# Patient Record
Sex: Male | Born: 1940
Health system: Southern US, Community
[De-identification: ages and names within clinical notes are randomized; demographics above are authoritative.]

## PROBLEM LIST (undated history)

## (undated) DIAGNOSIS — I4891 Unspecified atrial fibrillation: Secondary | ICD-10-CM

## (undated) DIAGNOSIS — E785 Hyperlipidemia, unspecified: Secondary | ICD-10-CM

## (undated) DIAGNOSIS — J189 Pneumonia, unspecified organism: Secondary | ICD-10-CM

## (undated) DIAGNOSIS — R739 Hyperglycemia, unspecified: Secondary | ICD-10-CM

## (undated) DIAGNOSIS — I6529 Occlusion and stenosis of unspecified carotid artery: Secondary | ICD-10-CM

## (undated) DIAGNOSIS — I10 Essential (primary) hypertension: Secondary | ICD-10-CM

## (undated) DIAGNOSIS — I2510 Atherosclerotic heart disease of native coronary artery without angina pectoris: Secondary | ICD-10-CM

## (undated) DIAGNOSIS — G4730 Sleep apnea, unspecified: Secondary | ICD-10-CM

## (undated) DIAGNOSIS — M1712 Unilateral primary osteoarthritis, left knee: Secondary | ICD-10-CM

## (undated) DIAGNOSIS — R001 Bradycardia, unspecified: Secondary | ICD-10-CM

## (undated) HISTORY — DX: Atherosclerotic heart disease of native coronary artery without angina pectoris: I25.10

## (undated) HISTORY — DX: Unspecified atrial fibrillation: I48.91

## (undated) HISTORY — DX: Occlusion and stenosis of unspecified carotid artery: I65.29

## (undated) HISTORY — DX: Sleep apnea, unspecified: G47.30

## (undated) HISTORY — DX: Hyperlipidemia, unspecified: E78.5

## (undated) HISTORY — DX: Essential (primary) hypertension: I10

## (undated) HISTORY — PX: TONSILLECTOMY: SUR1361

## (undated) HISTORY — PX: KNEE ARTHROSCOPY: SUR90

---

## 1999-07-21 ENCOUNTER — Ambulatory Visit (HOSPITAL_COMMUNITY): Admission: RE | Admit: 1999-07-21 | Discharge: 1999-07-21 | Payer: Self-pay | Admitting: *Deleted

## 2008-11-01 HISTORY — PX: CORONARY ARTERY BYPASS GRAFT: SHX141

## 2008-11-11 ENCOUNTER — Ambulatory Visit: Payer: Self-pay | Admitting: Internal Medicine

## 2008-11-15 ENCOUNTER — Encounter: Payer: Self-pay | Admitting: Cardiothoracic Surgery

## 2008-11-15 ENCOUNTER — Ambulatory Visit: Payer: Self-pay | Admitting: Cardiothoracic Surgery

## 2008-11-15 ENCOUNTER — Ambulatory Visit: Payer: Self-pay | Admitting: Cardiology

## 2008-11-15 ENCOUNTER — Ambulatory Visit: Payer: Self-pay | Admitting: Vascular Surgery

## 2008-11-15 ENCOUNTER — Inpatient Hospital Stay (HOSPITAL_COMMUNITY): Admission: EM | Admit: 2008-11-15 | Discharge: 2008-11-23 | Payer: Self-pay | Admitting: *Deleted

## 2008-12-10 ENCOUNTER — Ambulatory Visit: Payer: Self-pay | Admitting: Internal Medicine

## 2008-12-12 ENCOUNTER — Ambulatory Visit: Payer: Self-pay | Admitting: Cardiothoracic Surgery

## 2008-12-12 ENCOUNTER — Encounter: Admission: RE | Admit: 2008-12-12 | Discharge: 2008-12-12 | Payer: Self-pay | Admitting: Cardiothoracic Surgery

## 2008-12-19 ENCOUNTER — Encounter (HOSPITAL_COMMUNITY): Admission: RE | Admit: 2008-12-19 | Discharge: 2009-01-03 | Payer: Self-pay | Admitting: Cardiology

## 2009-01-22 DIAGNOSIS — Z9889 Other specified postprocedural states: Secondary | ICD-10-CM

## 2009-01-22 DIAGNOSIS — I10 Essential (primary) hypertension: Secondary | ICD-10-CM | POA: Insufficient documentation

## 2009-01-22 DIAGNOSIS — I2510 Atherosclerotic heart disease of native coronary artery without angina pectoris: Secondary | ICD-10-CM

## 2009-01-22 DIAGNOSIS — E785 Hyperlipidemia, unspecified: Secondary | ICD-10-CM

## 2009-01-22 DIAGNOSIS — Z9089 Acquired absence of other organs: Secondary | ICD-10-CM

## 2009-01-22 DIAGNOSIS — Z951 Presence of aortocoronary bypass graft: Secondary | ICD-10-CM | POA: Insufficient documentation

## 2009-01-22 DIAGNOSIS — I4891 Unspecified atrial fibrillation: Secondary | ICD-10-CM | POA: Insufficient documentation

## 2009-01-22 DIAGNOSIS — I252 Old myocardial infarction: Secondary | ICD-10-CM

## 2009-01-23 ENCOUNTER — Encounter: Payer: Self-pay | Admitting: Internal Medicine

## 2009-01-23 ENCOUNTER — Ambulatory Visit: Payer: Self-pay | Admitting: Internal Medicine

## 2009-04-04 ENCOUNTER — Encounter: Payer: Self-pay | Admitting: Internal Medicine

## 2009-04-07 ENCOUNTER — Encounter: Payer: Self-pay | Admitting: Internal Medicine

## 2009-05-21 ENCOUNTER — Telehealth: Payer: Self-pay | Admitting: Internal Medicine

## 2009-07-09 ENCOUNTER — Ambulatory Visit: Payer: Self-pay | Admitting: Internal Medicine

## 2009-08-11 ENCOUNTER — Encounter: Payer: Self-pay | Admitting: Internal Medicine

## 2009-08-11 ENCOUNTER — Ambulatory Visit (HOSPITAL_BASED_OUTPATIENT_CLINIC_OR_DEPARTMENT_OTHER): Admission: RE | Admit: 2009-08-11 | Discharge: 2009-08-11 | Payer: Self-pay | Admitting: Internal Medicine

## 2009-08-26 ENCOUNTER — Ambulatory Visit: Payer: Self-pay | Admitting: Pulmonary Disease

## 2009-12-09 ENCOUNTER — Encounter: Payer: Self-pay | Admitting: Internal Medicine

## 2009-12-17 ENCOUNTER — Telehealth: Payer: Self-pay | Admitting: Internal Medicine

## 2010-01-14 ENCOUNTER — Ambulatory Visit: Payer: Self-pay | Admitting: Internal Medicine

## 2010-02-04 ENCOUNTER — Ambulatory Visit: Payer: Self-pay

## 2010-02-04 ENCOUNTER — Encounter: Payer: Self-pay | Admitting: Internal Medicine

## 2010-02-25 ENCOUNTER — Ambulatory Visit: Payer: Self-pay | Admitting: Internal Medicine

## 2010-02-26 LAB — CONVERTED CEMR LAB
Albumin: 3.9 g/dL (ref 3.5–5.2)
Alkaline Phosphatase: 82 units/L (ref 39–117)
Cholesterol: 137 mg/dL (ref 0–200)
LDL Cholesterol: 81 mg/dL (ref 0–99)
Total CHOL/HDL Ratio: 3
Total Protein: 6.9 g/dL (ref 6.0–8.3)
Triglycerides: 42 mg/dL (ref 0.0–149.0)
VLDL: 8.4 mg/dL (ref 0.0–40.0)

## 2010-06-15 ENCOUNTER — Telehealth: Payer: Self-pay | Admitting: Internal Medicine

## 2010-06-26 ENCOUNTER — Telehealth: Payer: Self-pay | Admitting: Internal Medicine

## 2010-07-02 ENCOUNTER — Encounter (INDEPENDENT_AMBULATORY_CARE_PROVIDER_SITE_OTHER): Payer: Self-pay | Admitting: *Deleted

## 2010-08-19 ENCOUNTER — Ambulatory Visit: Payer: Self-pay | Admitting: Internal Medicine

## 2010-10-21 IMAGING — CT CT HEAD W/O CM
1 series · 16 of 30 positions shown, 20 images · non-contrast
Comparison: None

CLINICAL DATA: Episode of dizziness and unsteady gait 1 week ago.
Hypertension.

CT HEAD WITHOUT CONTRAST
TECHNIQUE: Contiguous axial images were obtained from the base of
the skull through the vertex without intravenous contrast.

[Series 2: head_seq 4.5 h37s st · axial · 0.45mm/px · z∈[-140,+8]mm · 16 of 36 slices shown, 20 images]
[im 2/36  brain]
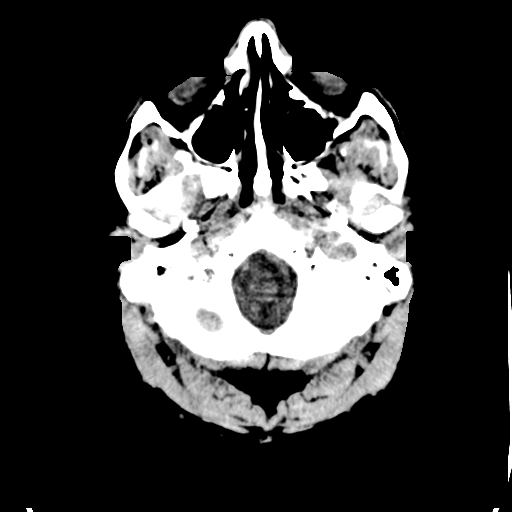
[im 2/36  bone]
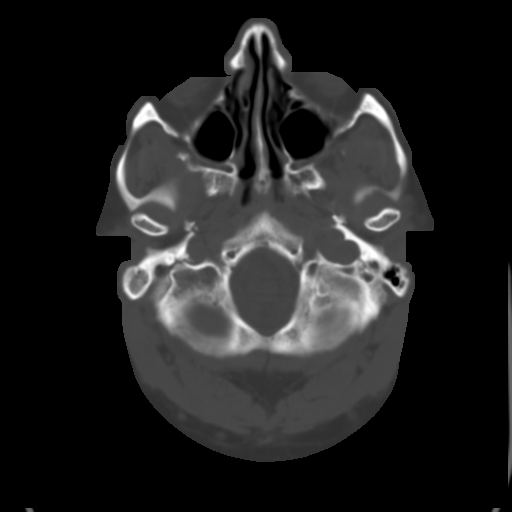
[im 4/36  brain]
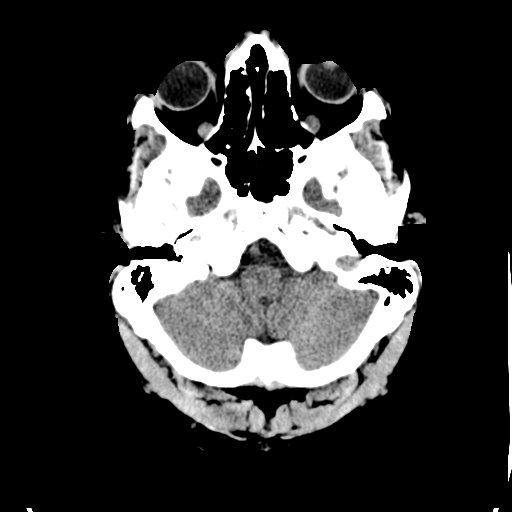
[im 7/36  brain]
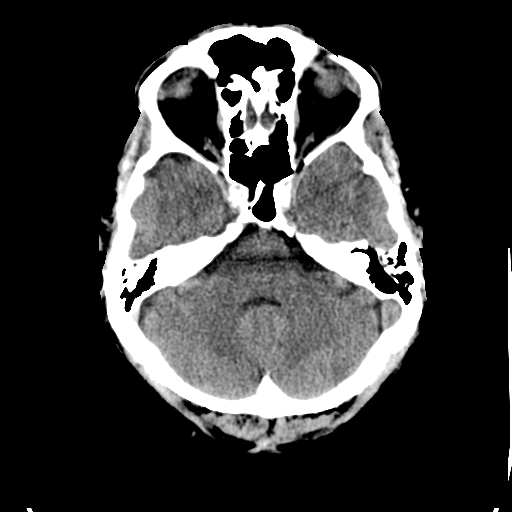
[im 9/36  brain]
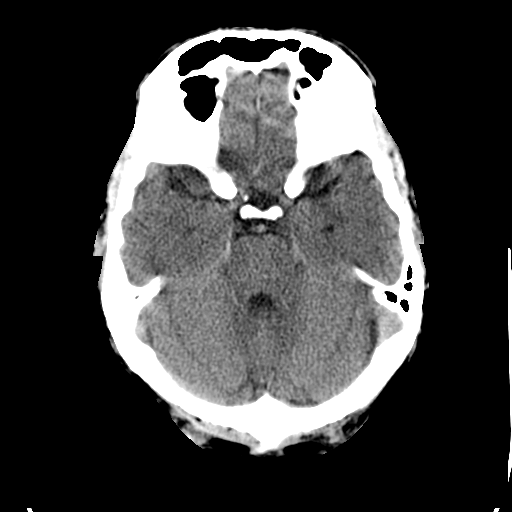
[im 10/36  brain]
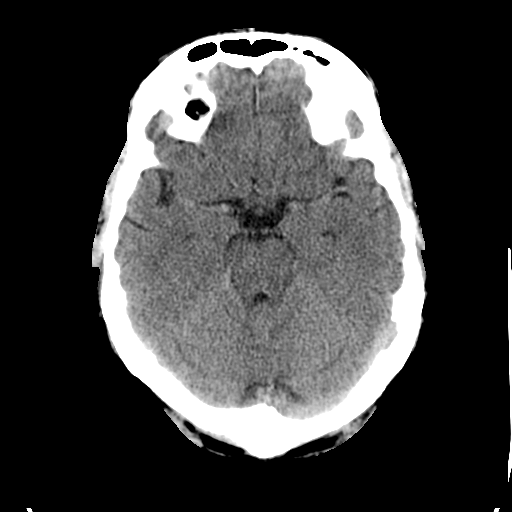
[im 10/36  bone]
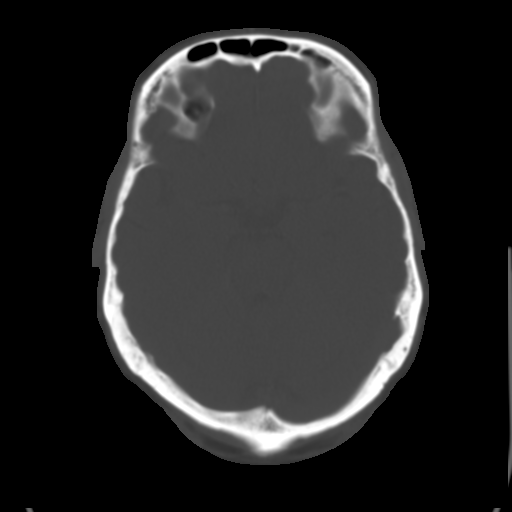
[im 13/36  brain]
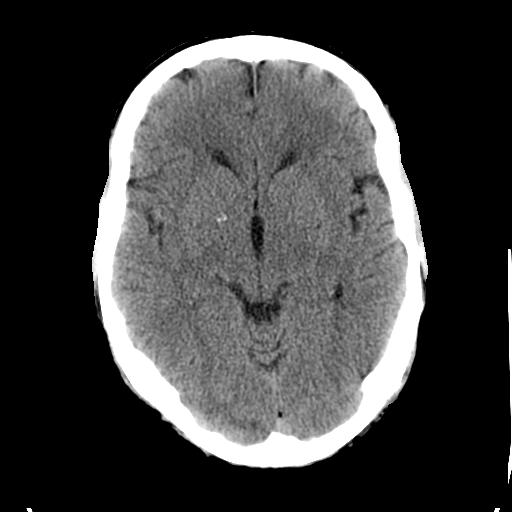
[im 15/36  brain]
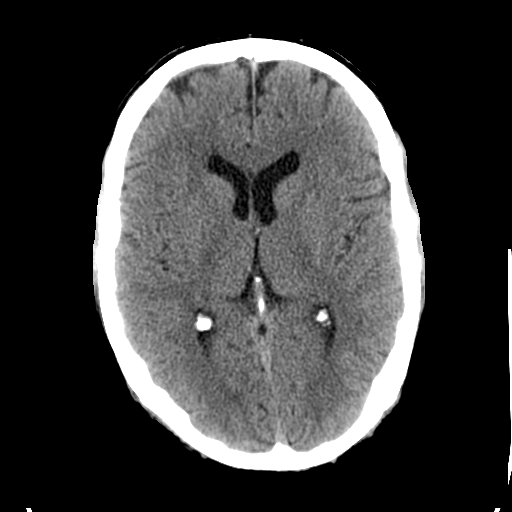
[im 17/36  brain]
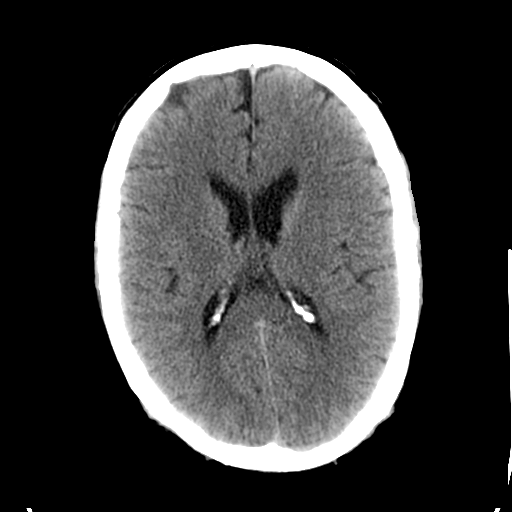
[im 19/36  brain]
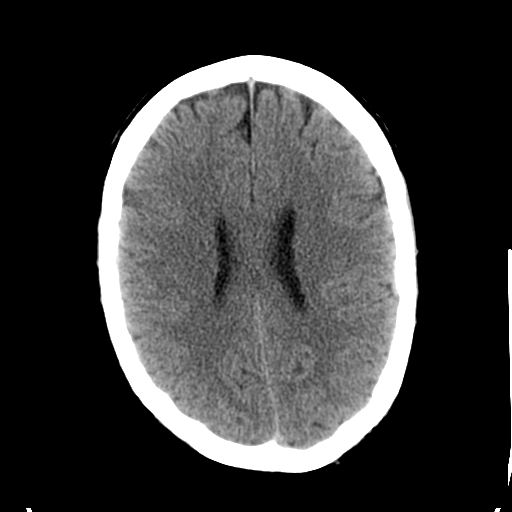
[im 19/36  bone]
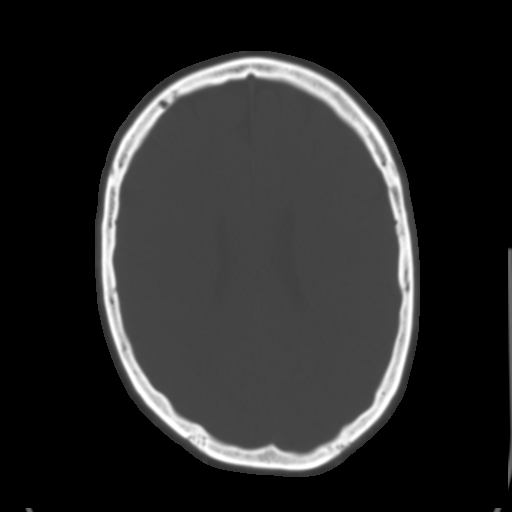
[im 21/36  brain]
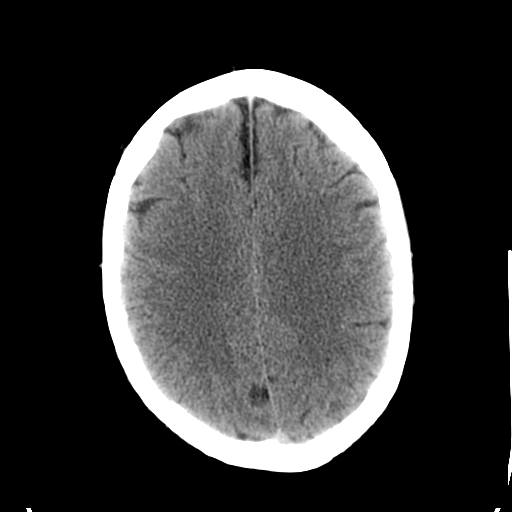
[im 23/36  brain]
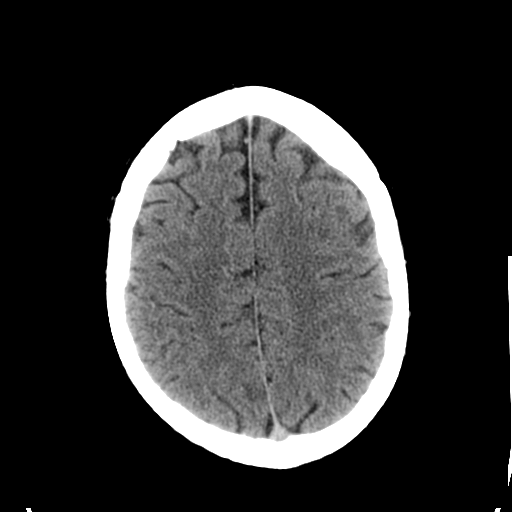
[im 26/36  brain]
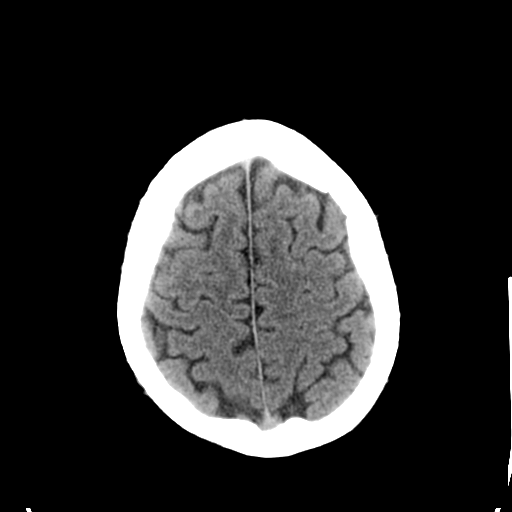
[im 27/36  brain]
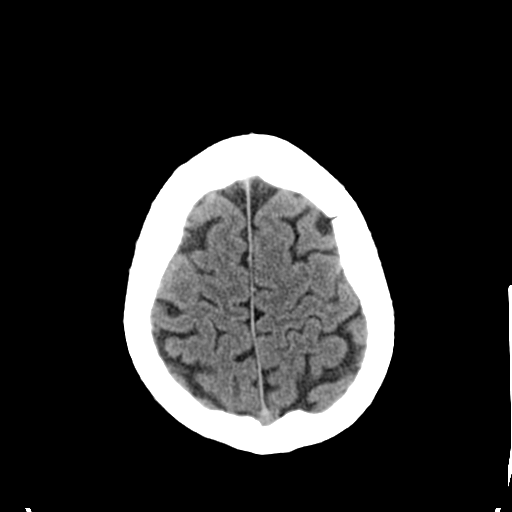
[im 27/36  bone]
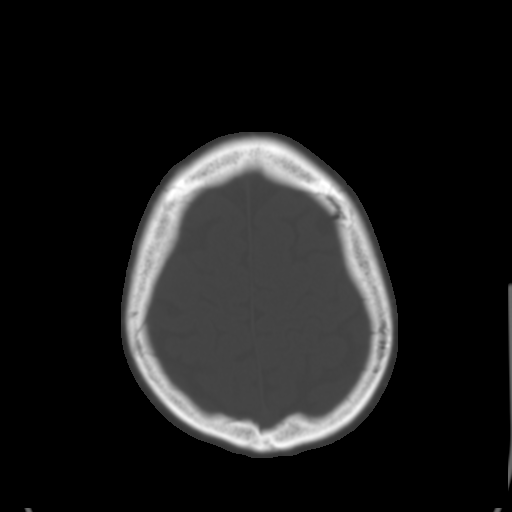
[im 29/36  brain]
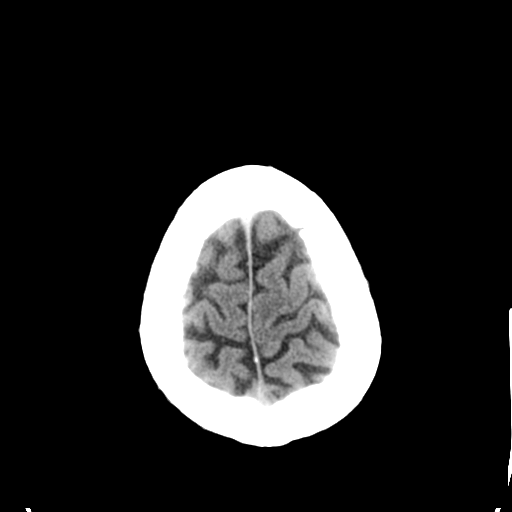
[im 32/36  brain]
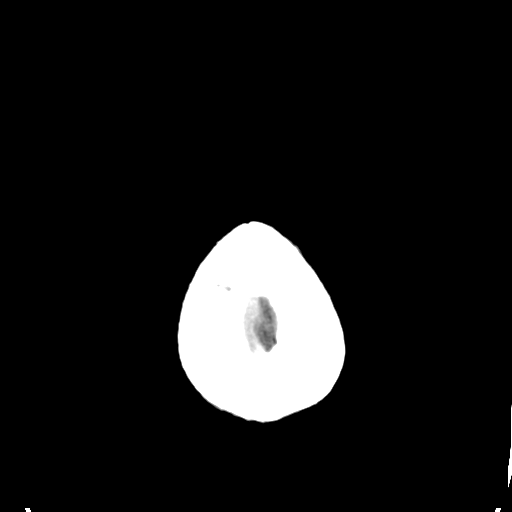
[im 34/36  brain]
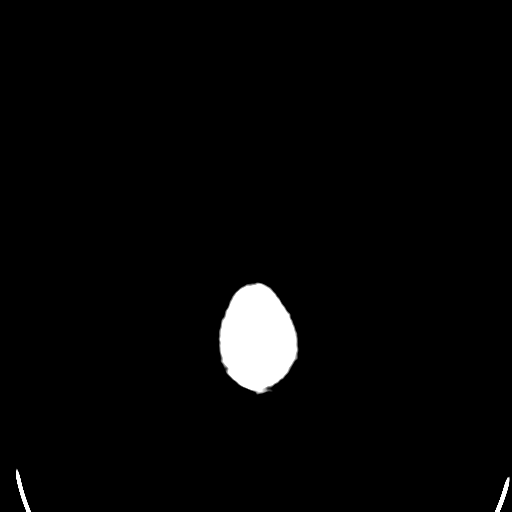

[16 of 30 positions shown; findings below may reference images not displayed]

FINDINGS: There is no evidence of acute intracranial hemorrhage,
mass lesion, brain edema or extra-axial fluid collection.  The
ventricles and subarachnoid spaces are appropriately sized for age.
There is no CT evidence of acute infarction.  Low density adjacent
to the right parietal lobe on image 21 is attributed to volume
averaging with an adjacent subarachnoid space

The visualized paranasal sinuses are clear.  The calvarium is
intact.
IMPRESSION: No acute intracranial findings.

## 2010-12-03 NOTE — Assessment & Plan Note (Signed)
Summary: Charles House  Medications Added VITAMIN D3 1000 UNIT CAPS (CHOLECALCIFEROL) once daily      Allergies Added: NKDA  Primary Charles House:  Charles Bouche, MD   History of Present Illness: The patient presents today for routine cardiology followup. He reports doing very well since last being seen in our clinic.  He has lost 27 lbs.  He remains active without difficulty.  The patient denies symptoms of palpitations, chest pain, shortness of breath, orthopnea, PND, lower extremity edema, dizziness, presyncope, syncope, or neurologic sequela. The patient is tolerating medications without difficulties and is otherwise without complaint today.   Current Medications (verified): 1)  Aspirin 325 Mg Tabs (Aspirin) .... Once Daily 2)  Lipitor 20 Mg Tabs (Atorvastatin Calcium) .... Take One Tablet By Mouth Daily. 3)  Vitamin D3 1000 Unit Caps (Cholecalciferol) .... Once Daily  Allergies (verified): No Known Drug Allergies  Past History:  Past Medical History: Reviewed history from 01/14/2010 and no changes required.   1. Coronary artery disease, status post 6-vessel CABG on November 18, 2008, with a LIMA to LAD, SVG sequential to second obtuse marginal,       third obtuse marginal and distal circumflex, reverse saphenous vein       graft sequential to the distal right coronary artery, and the mid       posterior descending coronary artery, and left internal mammary to       the left anterior descending.   2. Hyperlipidemia.   3. Symptoms of sleep apnea.   4. Hypertension.   5. Paroxysmal atrial fibrillation.  6. Moderate bilateral carotid artery stenosis by ultrasound 1/10  Past Surgical History: Reviewed history from 01/22/2009 and no changes required. TONSILLECTOMY, HX OF (ICD-V45.79) ARTHROSCOPY, RIGHT KNEE, HX OF (ICD-V45.89) CORONARY ARTERY BYPASS GRAFT, HX OF (ICD-V45.81)  Vital Signs:  Patient profile:   70 year old male Height:      70 inches Weight:      180 pounds BMI:      25.92 Pulse rate:   55 / minute Pulse rhythm:   regular BP sitting:   122 / 70  (left arm)  Vitals Entered By: Laurance Flatten CMA (August 19, 2010 10:45 AM)  Physical Exam  General:  Well developed, well nourished, in no acute distress. Head:  normocephalic and atraumatic Eyes:  PERRLA/EOM intact; conjunctiva and lids normal. Mouth:  Teeth, gums and palate normal. Oral mucosa normal. Neck:  Neck supple, no JVD. No masses, thyromegaly or abnormal cervical nodes. Lungs:  Clear bilaterally to auscultation and percussion. Heart:  Non-displaced PMI, chest non-tender; regular rate and rhythm, S1, S2 without murmurs, rubs or gallops. Carotid upstroke normal, no bruit. Normal abdominal aortic size, no bruits. Femorals normal pulses, no bruits. Pedals normal pulses. No edema, no varicosities. Abdomen:  Bowel sounds positive; abdomen soft and non-tender without masses, organomegaly, or hernias noted. No hepatosplenomegaly. Msk:  Back normal, normal gait. Muscle strength and tone normal. Pulses:  pulses normal in all 4 extremities Extremities:  No clubbing or cyanosis. Neurologic:  Alert and oriented x 3.   EKG  Procedure date:  08/19/2010  Findings:      sinus bradycardia 55 bpm, otherwise normal ekg  Impression & Recommendations:  Problem # 1:  CORONARY ARTERY DISEASE (ICD-414.00) doing well continue ASA and lipitor not on beta blockers due to asymptomatic bradycardia  Problem # 2:  SLEEP APNEA, HX OF (ICD-780.57) controlled with weight loss  Problem # 3:  HYPERLIPIDEMIA (ICD-272.4) controlled  with lipitor  Other Orders: EKG w/ Interpretation (93000)  Patient Instructions: 1)  return in 12 months

## 2010-12-03 NOTE — Progress Notes (Signed)
Summary: tooth extraction   Phone Note From Other Clinic   Caller: nurse susie Summary of Call: pt needs 9 extractions how long does pt need to off coumadin prior to extraction  ofc [phone] Susie. pt wants to have this done ASAP Initial call taken by: Edman Circle,  June 15, 2010 9:57 AM  Follow-up for Phone Call        ok to stop 5 days prior per Dr [name] spoke with Susie she is aware Dennis Bast, RN, BSN  June 15, 2010 2:04 PM     Appended Document: tooth extraction pt not on Coumadin but will stop Asprin 325mg  daily

## 2010-12-03 NOTE — Progress Notes (Signed)
Summary: pt needs to have teeth extracted   Phone Note Call from Patient Call back at Home Phone [phone]   Caller: Spouse/Charles House Reason for Call: Talk to Nurse, Talk to Doctor Summary of Call: pt wants to have all teeth extracted at one time and wife is concerned and want to make sure it will be alright for that to be done all at one time Initial call taken by: Omer Jack,  June 26, 2010 11:49 AM  Follow-up for Phone Call        I spoke with Charles House wife.  Her husband is having all teeth extracted without sedation.  He will follow as ordered before and stop Coumadin 5 days prior to procedure.  She wanted Dr. [name] to know. I will forward this to Dr. Ranee Gosselin RN

## 2010-12-03 NOTE — Progress Notes (Signed)
Summary: req call back   Phone Note Call from Patient Call back at Home Phone [phone]   Caller: Spouse Reason for Call: Talk to Nurse Summary of Call: request to speak to nurse regards to last ph call Initial call taken by: Migdalia Dk,  December 17, 2009 11:14 AM  Follow-up for Phone Call        spoke with wife and husband.  He went to eye doctor today and they said he had some cholesterol "flakes" behind his eye.  Wanting to know what to do.  Spoke with Dr [name] we have just increased his cholesterol med and this will help with the reduction of that.  Pt aware. Dennis Bast, RN, BSN  December 17, 2009 1:57 PM

## 2010-12-03 NOTE — Assessment & Plan Note (Signed)
Summary: 6 MONTH/DMP  Medications Added SIMVASTATIN 20 MG TABS (SIMVASTATIN) one by mouth at bedtime      Allergies Added: NKDA  Visit Type:  Follow-up Primary Provider:  [name], MD   History of Present Illness: The patient presents today for routine cardiology followup. He reports doing very well since last being seen in our clinic. The patient denies any symptoms of palpitations, chest pain, shortness of breath, orthopnea, PND, lower extremity edema, erectile dysfunction, presyncope, syncope, or neurologic sequela.  Current Medications (verified): 1)  Lipitor 20 Mg Tabs (Atorvastatin Calcium) .... Take One Tablet By Mouth Daily. 2)  Aspirin 325 Mg Tabs (Aspirin) .... Once Daily  Allergies (verified): No Known Drug Allergies  Past History:  Past Medical History:   1. Coronary artery disease, status post 6-vessel CABG on November 18, 2008, with a LIMA to LAD, SVG sequential to second obtuse marginal,       third obtuse marginal and distal circumflex, reverse saphenous vein       graft sequential to the distal right coronary artery, and the mid       posterior descending coronary artery, and left internal mammary to       the left anterior descending.   2. Hyperlipidemia.   3. Symptoms of sleep apnea.   4. Hypertension.   5. Paroxysmal atrial fibrillation.  6. Moderate bilateral carotid artery stenosis by ultrasound 1/10  Past Surgical History: Reviewed history from 01/22/2009 and no changes required. TONSILLECTOMY, HX OF (ICD-V45.79) ARTHROSCOPY, RIGHT KNEE, HX OF (ICD-V45.89) CORONARY ARTERY BYPASS GRAFT, HX OF (ICD-V45.81)  Vital Signs:  Patient profile:   70 year old male Height:      70 inches Weight:      207 pounds BMI:     29.81 Pulse rate:   65 / minute BP sitting:   120 / 80  (left arm)  Vitals Entered By: Laurance Flatten CMA (January 14, 2010 10:02 AM)  Physical Exam  General:  Well developed, well nourished, in no acute distress. Head:   normocephalic and atraumatic Eyes:  PERRLA/EOM intact; conjunctiva and lids normal. Mouth:  Teeth, gums and palate normal. Oral mucosa normal. Neck:  Neck supple, no JVD. No masses, thyromegaly or abnormal cervical nodes. Lungs:  Clear bilaterally to auscultation and percussion. Heart:  Non-displaced PMI, chest non-tender; regular rate and rhythm, S1, S2 without murmurs, rubs or gallops. Carotid upstroke normal, no bruit. Normal abdominal aortic size, no bruits. Femorals normal pulses, no bruits. Pedals normal pulses. No edema, no varicosities. Abdomen:  Bowel sounds positive; abdomen soft and non-tender without masses, organomegaly, or hernias noted. No hepatosplenomegaly. Msk:  Back normal, normal gait. Muscle strength and tone normal. Pulses:  pulses normal in all 4 extremities Extremities:  No clubbing or cyanosis. Neurologic:  Alert and oriented x 3. Skin:  Intact without lesions or rashes. Cervical Nodes:  no significant adenopathy Psych:  Normal affect.   EKG  Procedure date:  01/14/2010  Findings:      sinus rhythm 65 bpm, normal ekg  Impression & Recommendations:  Problem # 1:  CORONARY ARTERY BYPASS GRAFT, HX OF (ICD-V45.81)  stable without symptoms of ischemia no changes today he did not tolerate beta  blockers his EF is preserved and BP is normal  Orders: Carotid Duplex (Carotid Duplex)  Problem # 2:  SLEEP APNEA, HX OF (ICD-780.57) pt has moderate OSA he declines CPAP weight loss was advised today  Problem # 3:  HYPERLIPIDEMIA (ICD-272.4)  His updated medication list for this problem includes:    Simvastatin 20 Mg Tabs (Simvastatin) ..... One by mouth at bedtime  His updated medication list for this problem includes:    Lipitor 20 Mg Tabs (Atorvastatin calcium) .Marland Kitchen... Take one tablet by mouth daily.  Orders: Carotid Duplex (Carotid Duplex)  Other Orders: EKG w/ Interpretation (93000)  Patient Instructions: 1)  Your physician recommends that you  schedule a follow-up appointment in: 6 months with Dr [name] 2)  Your physician has requested that you have a carotid duplex. This test is an ultrasound of the carotid arteries in your neck. It looks at blood flow through these arteries that supply the brain with blood. Allow one hour for this exam. There are no restrictions or special instructions. 3)  Your physician has recommended you make the following change in your medication: stop Lipitor start Simvastatin 20mg  at bedtime 4)  Your physician recommends that you return for lab work in:6weeks liver profile and fasting lipid profile Prescriptions: SIMVASTATIN 20 MG TABS (SIMVASTATIN) one by mouth at bedtime  #30 x 11   Entered by:   Dennis Bast, RN, BSN   Authorized by:   [name], MD   Signed by:   Dennis Bast, RN, BSN on 01/14/2010   Method used:   Electronically to        [id number] (retail)       [address]       [place], [place]  [address]       Ph: [phone]       Fax: [phone]   RxID:   [id number]

## 2010-12-03 NOTE — Letter (Signed)
Summary: Appointment - Reminder 2  [facility], Main Office  [address]   [place], [place] [address]   Phone: [phone]  Fax: [phone]     July 02, 2010 MRN: [id number]   Charles House [address] [place], [place]  [address]   Dear Mr. [name],  Our records indicate that it is time to schedule a follow-up appointment.  Dr. [name] recommended that you follow up with [place] in September. It is very important that we reach you to schedule this appointment. We look forward to participating in your health care needs. Please contact us at the number listed above at your earliest convenience to schedule your appointment.  If you are unable to make an appointment at this time, give [place] a call so we can update our records.     Sincerely,   Glass blower/designer

## 2011-01-22 ENCOUNTER — Other Ambulatory Visit: Payer: Self-pay | Admitting: Internal Medicine

## 2011-01-28 MED ORDER — ATORVASTATIN CALCIUM 20 MG PO TABS: 20.0000 mg | ORAL_TABLET | Freq: Every day | ORAL | Status: DC

## 2011-01-28 NOTE — Telephone Encounter (Signed)
[address] °

## 2011-02-15 LAB — POCT I-STAT 3, VENOUS BLOOD GAS (G3P V)
Bicarbonate: 25.7 mEq/L — ABNORMAL HIGH (ref 20.0–24.0)
TCO2: 27 mmol/L (ref 0–100)
pCO2, Ven: 43.2 mmHg — ABNORMAL LOW (ref 45.0–50.0)
pH, Ven: 7.383 — ABNORMAL HIGH (ref 7.250–7.300)
pO2, Ven: 47 mmHg — ABNORMAL HIGH (ref 30.0–45.0)

## 2011-02-15 LAB — MAGNESIUM
Magnesium: 2.5 mg/dL (ref 1.5–2.5)
Magnesium: 2.5 mg/dL (ref 1.5–2.5)

## 2011-02-15 LAB — BASIC METABOLIC PANEL
BUN: 22 mg/dL (ref 6–23)
BUN: 10 mg/dL (ref 6–23)
BUN: 12 mg/dL (ref 6–23)
BUN: 14 mg/dL (ref 6–23)
BUN: 15 mg/dL (ref 6–23)
BUN: 18 mg/dL (ref 6–23)
CO2: 24 mEq/L (ref 19–32)
CO2: 25 mEq/L (ref 19–32)
CO2: 26 mEq/L (ref 19–32)
CO2: 27 mEq/L (ref 19–32)
CO2: 27 mEq/L (ref 19–32)
CO2: 28 mEq/L (ref 19–32)
Calcium: 8.1 mg/dL — ABNORMAL LOW (ref 8.4–10.5)
Calcium: 8.4 mg/dL (ref 8.4–10.5)
Calcium: 8.4 mg/dL (ref 8.4–10.5)
Calcium: 8.5 mg/dL (ref 8.4–10.5)
Calcium: 8.5 mg/dL (ref 8.4–10.5)
Chloride: 107 mEq/L (ref 96–112)
Chloride: 100 mEq/L (ref 96–112)
Chloride: 104 mEq/L (ref 96–112)
Chloride: 106 mEq/L (ref 96–112)
Chloride: 109 mEq/L (ref 96–112)
Chloride: 110 mEq/L (ref 96–112)
Creatinine, Ser: 0.99 mg/dL (ref 0.4–1.5)
Creatinine, Ser: 1 mg/dL (ref 0.4–1.5)
Creatinine, Ser: 1.03 mg/dL (ref 0.4–1.5)
Creatinine, Ser: 1.04 mg/dL (ref 0.4–1.5)
Creatinine, Ser: 1.05 mg/dL (ref 0.4–1.5)
Creatinine, Ser: 1.06 mg/dL (ref 0.4–1.5)
GFR calc Af Amer: >60 mL/min (ref >60)
GFR calc Af Amer: >60 mL/min (ref >60)
GFR calc Af Amer: >60 mL/min (ref >60)
GFR calc Af Amer: >60 mL/min (ref >60)
GFR calc Af Amer: >60 mL/min (ref >60)
GFR calc Af Amer: >60 mL/min (ref >60)
GFR calc non Af Amer: 54 mL/min — ABNORMAL LOW (ref >60)
GFR calc non Af Amer: >60 mL/min (ref >60)
GFR calc non Af Amer: >60 mL/min (ref >60)
GFR calc non Af Amer: >60 mL/min (ref >60)
GFR calc non Af Amer: >60 mL/min (ref >60)
Glucose, Bld: 124 mg/dL — ABNORMAL HIGH (ref 70–99)
Glucose, Bld: 125 mg/dL — ABNORMAL HIGH (ref 70–99)
Glucose, Bld: 126 mg/dL — ABNORMAL HIGH (ref 70–99)
Glucose, Bld: 126 mg/dL — ABNORMAL HIGH (ref 70–99)
Glucose, Bld: 138 mg/dL — ABNORMAL HIGH (ref 70–99)
Potassium: 3.5 mEq/L (ref 3.5–5.1)
Potassium: 3.5 mEq/L (ref 3.5–5.1)
Potassium: 3.7 mEq/L (ref 3.5–5.1)
Potassium: 4.1 mEq/L (ref 3.5–5.1)
Potassium: 4.3 mEq/L (ref 3.5–5.1)
Sodium: 140 mEq/L (ref 135–145)
Sodium: 136 mEq/L (ref 135–145)
Sodium: 139 mEq/L (ref 135–145)
Sodium: 139 mEq/L (ref 135–145)
Sodium: 140 mEq/L (ref 135–145)
Sodium: 142 mEq/L (ref 135–145)

## 2011-02-15 LAB — LIPID PANEL
Cholesterol: 133 mg/dL (ref 0–200)
LDL Cholesterol: 80 mg/dL (ref 0–99)
Triglycerides: 93 mg/dL (ref <150)

## 2011-02-15 LAB — CBC
HCT: 45.1 % (ref 39.0–52.0)
HCT: 26.8 % — ABNORMAL LOW (ref 39.0–52.0)
HCT: 27.1 % — ABNORMAL LOW (ref 39.0–52.0)
HCT: 28.7 % — ABNORMAL LOW (ref 39.0–52.0)
HCT: 28.8 % — ABNORMAL LOW (ref 39.0–52.0)
HCT: 31.3 % — ABNORMAL LOW (ref 39.0–52.0)
HCT: 38 % — ABNORMAL LOW (ref 39.0–52.0)
HCT: 38 % — ABNORMAL LOW (ref 39.0–52.0)
Hemoglobin: 15.1 g/dL (ref 13.0–17.0)
Hemoglobin: 10.5 g/dL — ABNORMAL LOW (ref 13.0–17.0)
Hemoglobin: 12.9 g/dL — ABNORMAL LOW (ref 13.0–17.0)
Hemoglobin: 9.1 g/dL — ABNORMAL LOW (ref 13.0–17.0)
Hemoglobin: 9.3 g/dL — ABNORMAL LOW (ref 13.0–17.0)
Hemoglobin: 9.7 g/dL — ABNORMAL LOW (ref 13.0–17.0)
Hemoglobin: 9.8 g/dL — ABNORMAL LOW (ref 13.0–17.0)
MCHC: 33.4 g/dL (ref 30.0–36.0)
MCHC: 33.7 g/dL (ref 30.0–36.0)
MCHC: 33.7 g/dL (ref 30.0–36.0)
MCHC: 34 g/dL (ref 30.0–36.0)
MCHC: 34 g/dL (ref 30.0–36.0)
MCHC: 34.1 g/dL (ref 30.0–36.0)
MCHC: 34.5 g/dL (ref 30.0–36.0)
MCV: 89.8 fL (ref 78.0–100.0)
MCV: 88.6 fL (ref 78.0–100.0)
MCV: 88.7 fL (ref 78.0–100.0)
MCV: 88.8 fL (ref 78.0–100.0)
MCV: 89.6 fL (ref 78.0–100.0)
MCV: 90.3 fL (ref 78.0–100.0)
MCV: 90.5 fL (ref 78.0–100.0)
MCV: 91 fL (ref 78.0–100.0)
Platelets: 101 10*3/uL — ABNORMAL LOW (ref 150–400)
Platelets: 162 10*3/uL (ref 150–400)
Platelets: 170 10*3/uL (ref 150–400)
Platelets: 171 10*3/uL (ref 150–400)
Platelets: 89 10*3/uL — ABNORMAL LOW (ref 150–400)
Platelets: 94 10*3/uL — ABNORMAL LOW (ref 150–400)
Platelets: 95 10*3/uL — ABNORMAL LOW (ref 150–400)
Platelets: 95 10*3/uL — ABNORMAL LOW (ref 150–400)
RBC: 5.02 MIL/uL (ref 4.22–5.81)
RBC: 3 MIL/uL — ABNORMAL LOW (ref 4.22–5.81)
RBC: 3.02 MIL/uL — ABNORMAL LOW (ref 4.22–5.81)
RBC: 3.16 MIL/uL — ABNORMAL LOW (ref 4.22–5.81)
RBC: 3.21 MIL/uL — ABNORMAL LOW (ref 4.22–5.81)
RBC: 3.45 MIL/uL — ABNORMAL LOW (ref 4.22–5.81)
RBC: 4.24 MIL/uL (ref 4.22–5.81)
RBC: 4.29 MIL/uL (ref 4.22–5.81)
RBC: 4.29 MIL/uL (ref 4.22–5.81)
RDW: 12.6 % (ref 11.5–15.5)
RDW: 12.7 % (ref 11.5–15.5)
RDW: 12.7 % (ref 11.5–15.5)
RDW: 12.9 % (ref 11.5–15.5)
RDW: 13 % (ref 11.5–15.5)
RDW: 13.4 % (ref 11.5–15.5)
WBC: 7 10*3/uL (ref 4.0–10.5)
WBC: 10.6 10*3/uL — ABNORMAL HIGH (ref 4.0–10.5)
WBC: 6.9 10*3/uL (ref 4.0–10.5)
WBC: 7.4 10*3/uL (ref 4.0–10.5)
WBC: 7.5 10*3/uL (ref 4.0–10.5)
WBC: 7.6 10*3/uL (ref 4.0–10.5)
WBC: 8 10*3/uL (ref 4.0–10.5)
WBC: 8.2 10*3/uL (ref 4.0–10.5)
WBC: 8.4 10*3/uL (ref 4.0–10.5)

## 2011-02-15 LAB — APTT
aPTT: 27 seconds (ref 24–37)
aPTT: 31 seconds (ref 24–37)

## 2011-02-15 LAB — POCT I-STAT 3, ART BLOOD GAS (G3+)
Acid-base deficit: 3 mmol/L — ABNORMAL HIGH (ref 0.0–2.0)
Bicarbonate: 24.1 mEq/L — ABNORMAL HIGH (ref 20.0–24.0)
Bicarbonate: 24.9 mEq/L — ABNORMAL HIGH (ref 20.0–24.0)
Bicarbonate: 27.5 mEq/L — ABNORMAL HIGH (ref 20.0–24.0)
O2 Saturation: 100 %
O2 Saturation: 98 %
Patient temperature: 36.2
Patient temperature: 37.9
TCO2: 23 mmol/L (ref 0–100)
TCO2: 29 mmol/L (ref 0–100)
pCO2 arterial: 41.6 mmHg (ref 35.0–45.0)
pH, Arterial: 7.33 — ABNORMAL LOW (ref 7.350–7.450)
pH, Arterial: 7.43 (ref 7.350–7.450)
pH, Arterial: 7.451 — ABNORMAL HIGH (ref 7.350–7.450)
pO2, Arterial: 251 mmHg — ABNORMAL HIGH (ref 80.0–100.0)
pO2, Arterial: 274 mmHg — ABNORMAL HIGH (ref 80.0–100.0)
pO2, Arterial: 296 mmHg — ABNORMAL HIGH (ref 80.0–100.0)

## 2011-02-15 LAB — POCT I-STAT 4, (NA,K, GLUC, HGB,HCT)
Glucose, Bld: 102 mg/dL — ABNORMAL HIGH (ref 70–99)
Glucose, Bld: 116 mg/dL — ABNORMAL HIGH (ref 70–99)
Glucose, Bld: 95 mg/dL (ref 70–99)
Glucose, Bld: 99 mg/dL (ref 70–99)
HCT: 25 % — ABNORMAL LOW (ref 39.0–52.0)
HCT: 26 % — ABNORMAL LOW (ref 39.0–52.0)
HCT: 26 % — ABNORMAL LOW (ref 39.0–52.0)
HCT: 37 % — ABNORMAL LOW (ref 39.0–52.0)
Hemoglobin: 11.6 g/dL — ABNORMAL LOW (ref 13.0–17.0)
Hemoglobin: 8.5 g/dL — ABNORMAL LOW (ref 13.0–17.0)
Hemoglobin: 8.8 g/dL — ABNORMAL LOW (ref 13.0–17.0)
Hemoglobin: 8.8 g/dL — ABNORMAL LOW (ref 13.0–17.0)
Hemoglobin: 8.8 g/dL — ABNORMAL LOW (ref 13.0–17.0)
Potassium: 3.8 mEq/L (ref 3.5–5.1)
Potassium: 3.8 mEq/L (ref 3.5–5.1)
Potassium: 4.4 mEq/L (ref 3.5–5.1)
Potassium: 4.5 mEq/L (ref 3.5–5.1)
Potassium: 4.5 mEq/L (ref 3.5–5.1)
Sodium: 137 mEq/L (ref 135–145)

## 2011-02-15 LAB — URINALYSIS, ROUTINE W REFLEX MICROSCOPIC
Bilirubin Urine: NEGATIVE
Glucose, UA: NEGATIVE mg/dL
Hgb urine dipstick: NEGATIVE
Ketones, ur: NEGATIVE mg/dL
Nitrite: NEGATIVE
Protein, ur: NEGATIVE mg/dL
Specific Gravity, Urine: 1.012 (ref 1.005–1.030)
Urobilinogen, UA: 0.2 mg/dL (ref 0.0–1.0)
pH: 6 (ref 5.0–8.0)

## 2011-02-15 LAB — CK TOTAL AND CKMB (NOT AT ARMC)
CK, MB: 19 ng/mL — ABNORMAL HIGH (ref 0.3–4.0)
Relative Index: 2.1 (ref 0.0–2.5)
Relative Index: 3.3 — ABNORMAL HIGH (ref 0.0–2.5)
Relative Index: 4.2 — ABNORMAL HIGH (ref 0.0–2.5)
Total CK: 450 U/L — ABNORMAL HIGH (ref 7–232)

## 2011-02-15 LAB — GLUCOSE, CAPILLARY
Glucose-Capillary: 109 mg/dL — ABNORMAL HIGH (ref 70–99)
Glucose-Capillary: 110 mg/dL — ABNORMAL HIGH (ref 70–99)
Glucose-Capillary: 112 mg/dL — ABNORMAL HIGH (ref 70–99)
Glucose-Capillary: 119 mg/dL — ABNORMAL HIGH (ref 70–99)
Glucose-Capillary: 120 mg/dL — ABNORMAL HIGH (ref 70–99)
Glucose-Capillary: 122 mg/dL — ABNORMAL HIGH (ref 70–99)
Glucose-Capillary: 122 mg/dL — ABNORMAL HIGH (ref 70–99)
Glucose-Capillary: 124 mg/dL — ABNORMAL HIGH (ref 70–99)
Glucose-Capillary: 125 mg/dL — ABNORMAL HIGH (ref 70–99)
Glucose-Capillary: 130 mg/dL — ABNORMAL HIGH (ref 70–99)
Glucose-Capillary: 131 mg/dL — ABNORMAL HIGH (ref 70–99)
Glucose-Capillary: 137 mg/dL — ABNORMAL HIGH (ref 70–99)
Glucose-Capillary: 139 mg/dL — ABNORMAL HIGH (ref 70–99)
Glucose-Capillary: 98 mg/dL (ref 70–99)
Glucose-Capillary: 99 mg/dL (ref 70–99)

## 2011-02-15 LAB — BLOOD GAS, ARTERIAL
Acid-Base Excess: 0.1 mmol/L (ref 0.0–2.0)
Bicarbonate: 24.5 mEq/L — ABNORMAL HIGH (ref 20.0–24.0)
Drawn by: 28701
FIO2: 0.28 %
O2 Saturation: 97.8 %
Patient temperature: 98.6
TCO2: 25.8 mmol/L (ref 0–100)
pCO2 arterial: 41.8 mmHg (ref 35.0–45.0)
pH, Arterial: 7.386 (ref 7.350–7.450)
pO2, Arterial: 92.6 mmHg (ref 80.0–100.0)

## 2011-02-15 LAB — COMPREHENSIVE METABOLIC PANEL
ALT: 21 U/L (ref 0–53)
AST: 19 U/L (ref 0–37)
Albumin: 3.3 g/dL — ABNORMAL LOW (ref 3.5–5.2)
Alkaline Phosphatase: 75 U/L (ref 39–117)
BUN: 11 mg/dL (ref 6–23)
CO2: 28 mEq/L (ref 19–32)
Calcium: 8.8 mg/dL (ref 8.4–10.5)
Chloride: 111 mEq/L (ref 96–112)
Creatinine, Ser: 1.1 mg/dL (ref 0.4–1.5)
GFR calc Af Amer: >60 mL/min (ref >60)
GFR calc non Af Amer: >60 mL/min (ref >60)
Glucose, Bld: 108 mg/dL — ABNORMAL HIGH (ref 70–99)
Potassium: 4.1 mEq/L (ref 3.5–5.1)
Sodium: 144 mEq/L (ref 135–145)
Total Bilirubin: 0.8 mg/dL (ref 0.3–1.2)
Total Protein: 6.1 g/dL (ref 6.0–8.3)

## 2011-02-15 LAB — CARDIAC PANEL(CRET KIN+CKTOT+MB+TROPI)
CK, MB: 7.2 ng/mL — ABNORMAL HIGH (ref 0.3–4.0)
Relative Index: 3.5 — ABNORMAL HIGH (ref 0.0–2.5)
Total CK: 141 U/L (ref 7–232)
Total CK: 154 U/L (ref 7–232)
Troponin I: 0.36 ng/mL — ABNORMAL HIGH (ref 0.00–0.06)

## 2011-02-15 LAB — CROSSMATCH: Antibody Screen: NEGATIVE

## 2011-02-15 LAB — DIFFERENTIAL
Basophils Relative: 0 % (ref 0–1)
Monocytes Relative: 6 % (ref 3–12)
Neutro Abs: 5.1 10*3/uL (ref 1.7–7.7)
Neutrophils Relative %: 64 % (ref 43–77)

## 2011-02-15 LAB — HEPARIN LEVEL (UNFRACTIONATED)
Heparin Unfractionated: 0.28 IU/mL — ABNORMAL LOW (ref 0.30–0.70)
Heparin Unfractionated: 0.44 IU/mL (ref 0.30–0.70)
Heparin Unfractionated: 0.53 IU/mL (ref 0.30–0.70)
Heparin Unfractionated: <0.1 IU/mL — ABNORMAL LOW (ref 0.30–0.70)

## 2011-02-15 LAB — POCT I-STAT, CHEM 8
BUN: 10 mg/dL (ref 6–23)
HCT: 30 % — ABNORMAL LOW (ref 39.0–52.0)
Hemoglobin: 10.2 g/dL — ABNORMAL LOW (ref 13.0–17.0)
Sodium: 142 mEq/L (ref 135–145)
TCO2: 23 mmol/L (ref 0–100)

## 2011-02-15 LAB — PROTIME-INR
INR: 1 (ref 0.00–1.49)
INR: 1.4 (ref 0.00–1.49)
Prothrombin Time: 13.1 seconds (ref 11.6–15.2)
Prothrombin Time: 18.2 seconds — ABNORMAL HIGH (ref 11.6–15.2)

## 2011-02-15 LAB — ABO/RH: ABO/RH(D): A NEG

## 2011-02-15 LAB — TROPONIN I: Troponin I: 0.03 ng/mL (ref 0.00–0.06)

## 2011-03-16 NOTE — H&P (Signed)
NAME:  Charles House, MATUSZAK NO.:  [id number]   MEDICAL RECORD NO.:  [id number]          PATIENT TYPE:  EMS   LOCATION:  [facility]                         FACILITY:  [facility]   PHYSICIAN:  Rollene Rotunda, MD, FACCDATE OF BIRTH:  Dec 27, 1940   DATE OF ADMISSION:  11/15/2008  DATE OF DISCHARGE:                              HISTORY & PHYSICAL   PRIMARY:  Charles House, M.D.   CARDIOLOGIST:  [name], M.D.   REASON FOR PRESENTATION:  Evaluate patient with chest pain in atrial  fibrillation.   HISTORY OF PRESENT ILLNESS:  The patient is a pleasant 70 year old  gentleman who actually saw Dr. [name] as a new patient on the 11th.  At  that time he had had some vague symptoms of atypical right-sided chest  pain.  He had also had an episode of dizziness and falling towards his  right about a week prior to that.  Because he had some known coronary  disease with apparent nonobstructive disease by catheterization some 9  years ago, he was referred to Dr. [name].  The patient had not been  describing reproducible exertional chest discomfort.  He had not been  having any shortness of breath, PND or orthopnea.  He had not been  noticing any palpitations.  He occasionally gets some indigestion and  a feeling like he has to burp.  This has been going on for about 3  months.  He had had some recent labile blood pressures.  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  He would not bring on any symptoms with  this.   Tonight he felt hot in his chest.  He took his blood pressure and  found his systolic to be greater than 200.  He went to the fire station  where he was found to be in atrial fibrillation.  He was actually  treated by EMS with adenosine and converted to sinus rhythm.  I have  reviewed these atrial fibrillation strips and find that he has diffuse  ST-segment depression with these.  There were some wide  complexes that  may represent aberrancy.  When he presented to [facility], he was pain free.  He was is in sinus rhythm.  However, he had persistent ST-segment  depression diffusely that has resolved subsequently on his follow-up  EKG.  First point of care markers are negative.   Of note, the patient was scheduled to have a stress perfusion study this  morning in our office.   PAST MEDICAL HISTORY:  1. Coronary artery disease (details not available).  2. Hypertension.  3. Hyperlipidemia.  4. Questionable obstructive sleep apnea.   PAST SURGICAL HISTORY:  Right knee arthroscopic surgery.   ALLERGIES:  None.   MEDICATIONS:  1. Aspirin 325 mg daily.  2. Diovan 80 mg daily.  3. Lipitor 20 mg daily.  4. Multivitamin.   SOCIAL HISTORY:  The patient is married.  He is a Education officer, environmental at [employer].  He  does not smoke cigarettes or drink alcohol.   FAMILY HISTORY:  Contributory for his mother having a stroke at age 2.  His father had emphysema and cerebrovascular disease, dying at 4.   REVIEW OF SYSTEMS:  As stated in the HPI and negative for all other  systems except for induration and swelling of his left index finger.  This just started today.  He does not remember any trauma.  It happened  after he reached into his golf bag.  He does not remember seeing any  spiders or suffering any wounds.   PHYSICAL EXAMINATION:  The patient is quite pleasant.  He is in no  distress.  Blood pressure 117/72, heart rate 83 and regular, respiratory  rate 18, afebrile.  HEENT:  Eyelids unremarkable, pupils equal and react to light, fundi not  visualized, oral mucosa unremarkable.  NECK:  No jugular distention at 45 degrees, carotid upstroke brisk and  symmetrical, no bruits, no thyromegaly.  LYMPHATICS:  No cervical, axillary or inguinal adenopathy.  LUNGS:  Clear to auscultation bilaterally.  BACK:  No costovertebral angle tenderness.  CHEST:  Unremarkable.  HEART:  PMI not displaced or sustained, S1-S2  within normal limits, no  S3, no S4, no clicks, no rubs, no murmurs.  ABDOMEN:  Obese, positive bowel sounds, normal in frequency and pitch,  no bruits, rebound, guarding, no midline pulsatile mass, no  hepatomegaly, no splenomegaly.  SKIN:  No rashes, no nodules.  EXTREMITIES:  2+ pulses throughout, no edema, no cyanosis, no clubbing.  NEURO:  Oriented to person, place and time, cranial nerves II-XII  grossly intact, motor grossly intact.   EKG:  Sinus rhythm, rate 85, axis within normal limits, intervals within  normal limits, no acute ST wave changes.   ASSESSMENT AND PLAN:  1. Chest:  The patient has some burning chest discomfort.  This      occurred while he was in A-fib with a rapid rate.  He had diffuse      significant ST-segment depression.  This is equivalent to a      positive exercise stress test.  Given this, the discomfort, the      previous coronary disease, the probability of obstructive coronary      disease is very high.  Therefore, he needs cardiac catheterization.      He understands clearly the risks and benefits.  He has been through      the procedure before.  He agrees to proceed.  2. Atrial fibrillation:  Now he is back in sinus rhythm.  We can      determine further his CHADS score after seeing his ejection      fraction and make a decision about further therapy and in      particular Coumadin.  For now I will treat him with beta blockers.      He will get aspirin.  3. Hypertension:  We will use beta blockers for his blood pressure      control for now.  4. Dyslipidemia:  He can continue on the Lipitor.  5. Risk reduction:  Will counsel him on exercise and diet.  He is      overweight.  6. Dizziness:  The patient was to have a head CT and carotid Dopplers      and this can continue as planned.  7. Swollen left index finger:  This was evaluated by the ER physician      and I spoke with him.  He suggested that we give him a tetanus shot  and then  observe it.  If it worsens, we might need to have a hand      specialist or start antibiotics.  He did not suggest antibiotics at      this point.      Rollene Rotunda, MD, [facility]  Electronically Signed     JH/MEDQ  D:  11/15/2008  T:  11/15/2008  Job:  [id number]

## 2011-03-16 NOTE — Op Note (Signed)
NAME:  Charles House, Charles House NO.:  [id number]   MEDICAL RECORD NO.:  [id number]          PATIENT TYPE:  INP   LOCATION:  2007                         FACILITY:  [facility]   PHYSICIAN:  Sheliah Plane, MD    DATE OF BIRTH:  1940/12/04   DATE OF PROCEDURE:  11/18/2008  DATE OF DISCHARGE:                               OPERATIVE REPORT   PREOPERATIVE DIAGNOSES:  Coronary occlusive disease with left main  obstruction and subendocardial myocardial infarction.   POSTOPERATIVE DIAGNOSES:  Coronary occlusive disease with left main  obstruction and subendocardial myocardial infarction.   SURGICAL PROCEDURES:  Coronary artery bypass grafting x6 with left  internal mammary to the left anterior descending coronary artery,  sequential reverse saphenous vein graft to the second obtuse marginal,  third obtuse marginal, and distal circumflex, reverse saphenous vein  graft sequentially to the distal right coronary artery and mid posterior  descending coronary artery and left internal mammary to the left  anterior descending coronary artery with left thigh and calf endovein  harvesting.   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 Riley Kill, which demonstrated  severe three-vessel coronary artery disease including 80-90% distal left  main, 80% right, 70% posterior descending, and diffuse disease  throughout the circ system with proximal 80% disease in the obtuse  marginal vessels.  Coronary artery bypass grafting was recommended.  The  patient agreed and signed informed consent.   DESCRIPTION OF PROCEDURE:  With Swan-Ganz and arterial line monitors in  place, the patient underwent general endotracheal anesthesia without  incident.  The skin, chest, and legs were prepped with  Betadine and  draped in usual sterile manner.  Using the Guidant, first incision was  made in the right knee.  This vein was small and not usable.  The  incision was made at the left knee, this vein was much better and using  a Guidant endovein harvesting system, vein was harvested endoscopically  from the thigh and calf.  The vein was removed and it was of good  quality and caliber.  Median sternotomy was performed.  Left internal  mammary artery was dissected down as a pedicle graft.  The distal artery  was divided, had good free flow.  Pericardium was opened.  Overall  ventricular function appeared preserved.  The patient was systemically  heparinized.  The ascending aorta, the right atrium was cannulated and  aortic root vent cardioplegia needle was introduced into the ascending  aorta.  The patient was placed on cardiopulmonary bypass 2.4 L per  minute per meter square.  Sites of anastomosis were selected and  dissected out of the epicardium.  The patient's body temperature was  cooled to 32 degrees.  Aortic crossclamp was applied and 500 mL of cold  blood potassium cardioplegia was administered.  Attention was turned  first to the obtuse marginal vessels.  First obtuse marginal was a small  vessel.  The  second and third were larger.  The distal circumflex was  smaller.  The third obtuse marginal vessel was the largest of the 4  branches.  This vessel was opened and I made a 1.5-mm probe using a  diamond type.  Side-to-side anastomosis was carried out with a segment  of reverse saphenous vein graft using running 8-0 Prolene.  The second  obtuse marginal was then opened and the vein graft was anastomosed side-  to-side to the second obtuse marginal also with a running 8-0 Prolene.  Distal extend of the vein was then anastomosed to the circumflex  coronary artery, which was smaller than the other 2, but did admit a 1-  mm probe.  Additional cold blood cardioplegia was administered down  the  vein grafts.  Attention was then turned to the right system.  In the  proximal posterior descending, there was disease and in the proximal  right and mid right coronary artery.  The distal right coronary artery  was opened and made a 1.5-mm probe; however, there was obvious  obstruction in the proximal PD.  The posterior lateral branch was free  of obstruction.  Using a longitudinal side-to-side anastomosis with a  running 8-0 Prolene, the vein graft was anastomosed to the distal right  coronary artery.  Distal extend of same vein was then carried to the  midportion of the posterior descending, which was opened and was a  smaller vessel, but admitted a 1-mm probe distally.  Using a running 8-0  Prolene, distal anastomosis was performed.  Attention was then turned to  the left anterior descending coronary artery which was opened in the  midportion.  The vessel was 1.5 mm in size.  Using a running 8-0  Prolene, the left internal mammary artery was anastomosed to left  anterior descending coronary artery.  There was appropriate rise in  myocardial septal temperature, was released with mammary bulldog.  The  mammary bulldog was placed back.  Additional cold blood cardioplegia was  administered with crossclamp still in place.  Three punch aortotomies  were performed and each of the three vein grafts were anastomosed to the  ascending aorta.  Air was evacuated from the ascending aorta and grafts  in the aortic crossclamp was removed.  Total crossclamp time of 109  minutes.  The patient spontaneously converted to a sinus rhythm.  Sites  of anastomosis were inspected and were free of bleeding.  Atrial and  ventricular pacing wire was applied.  He was then ventilated and weaned  from cardiopulmonary bypass on a low-dose dopamine.  He remained  hemodynamically stable.  He was decannulated in usual fashion.  Protamine sulfate was administered with operative field hemostatic.  Left pleural tube  was left in place.  Pericardium was loosely  reapproximated.  Blake mediastinal drain was left in and anterior  mediastinum was closed with #6 stainless steel wire.  Fascia closed with  interrupted 0-Vicryl, running 3-0 Vicryl, subcutaneous tissue 4-0  subcuticular stitch in the skin edges.  Dry dressings were applied.  Sponge and needle count was reported as correct at completion of the  procedure.  The patient tolerated procedure without obvious complication  and was transferred to surgical intensive care unit for further  postoperative care.      Sheliah Plane, MD  Electronically Signed     EG/MEDQ  D:  11/21/2008  T:  11/22/2008  Job:  [id number]   cc:   [name], MD

## 2011-03-16 NOTE — Letter (Signed)
November 11, 2008    Dr. Kem Boroughs  [facility]  [address]  [place], [place] [address]   RE:  Charles House  MRN:  [id number]  /  DOB:  1941-09-07   Dear Dr. Debroah Loop:   It was my pleasure to see your patient, Charles House, in cardiology  consultation today regarding symptomatic dizziness and chest pain.  As  you recall, Charles House is a pleasant 70 year old gentleman with a  history of coronary artery disease, hypertension, and hyperlipidemia who  presented to your office several days ago with symptoms of dizziness and  unsteadiness of gait.  The patient reports that approximately 1 week ago  he was standing in his bathroom when he acutely felt as if he were  falling towards the right.  He subsequently felt as if the room were  spinning.  Over the subsequent week, he had symptoms of dizziness  and  mild lightheadedness which she describes predominantly as an uneasy  sensation.  He denies chest pain, dysphasia, dysarthria, numbness,  weakness, tingling, headache, blurred vision, or other neurologic  symptoms at that time.  He denies palpitations, presyncope, or syncope.  He subsequently presented to your office on November 08, 2008, and was  found to have an elevated blood pressure for which he was initiated on  Diovan.  Since initiation of Diovan, the patient reports resolution in  his symptoms.  He has done quite well since that time.  He subsequently  developed right-sided chest discomfort, which he describes as a sharp  stabbing pain within his right lower chest, which is worse with  movement.  He is unaware of any injury or trauma.  He denies shortness  of breath, orthopnea, PND, lower extremity edema, or other concerns.  He  had a left heart catheterization approximately 9 years ago for which he  was told that he had nonobstructive coronary artery disease.  He now  presents for further cardiac evaluation due to concerns for  progressive  atherosclerosis.   PAST MEDICAL HISTORY:  1. Coronary artery disease (as above).  2. Hypertension.  3. Hyperlipidemia.  4. Status post right knee arthroscopy.  5. Symptoms of obstructive sleep apnea that he does not carry from      diagnosis.   ALLERGIES:  No known drug allergies.   HOME MEDICATIONS:  1. Aspirin 325 mg daily.  2. Diovan 80 mg daily.  3. Lipitor 20 mg daily.  4. Multivitamin daily.   SOCIAL HISTORY:  The patient lives in [place] with his spouse.  He is  a Education officer, environmental at [employer].  He denies alcohol or drug use.   FAMILY HISTORY:  Notable for coronary artery disease.  The patient's  mother died of stroke at age 68.  The patient's father died at age 27  with emphysema and cerebrovascular disease.   REVIEW OF SYSTEMS:  All systems are reviewed and negative except as  outlined in the HPI above.   PHYSICAL EXAMINATION:  VITALS:  Blood pressure 117/71 lying with a heart  rate of 61, 122/79 sitting with a heart rate of 71, and 127/70 standing  with of 67.  GENERAL:  The patient is a well-appearing male in no acute distress.  He  is alert and oriented x3.  HEENT:  Normocephalic and atraumatic.  Sclerae clear.  Conjunctivae  pink.  Oropharynx clear.  NECK:  Supple.  No thyromegaly, lymphadenopathy, or bruits.  LUNGS:  Clear to auscultation bilaterally.  HEART:  Regular rate and rhythm.  No murmurs, rubs, gallops.  GI:  Soft, nontender, and nondistended.  Positive bowel sounds.  EXTREMITIES:  No clubbing, cyanosis, or edema.  NEUROLOGIC:  Cranial nerves II through XII are intact.  Strength and  sensation are intact.  SKIN:  No ecchymosis or lacerations.  MUSCULOSKELETAL:  No deformity or atrophy.  PSYCH:  Euthymic mood.  Full affect.   EKG reveals sinus rhythm at 70 beats per minute with no significant ST/T-  wave changes.   LABORATORY DATA:  Labs from your office, November 05, 2008, reveal a  normal TSH, CBC, and comprehensive metabolic  profile.  The patient's  total cholesterol is 205, triglycerides 112, HDL 49, LDL 114.   IMPRESSION:  Charles House is a pleasant 70 year old gentleman with a  history of nonobstructive coronary artery disease and hypertension who  presents for further evaluation and management of unsteadiness,  dizziness, and atypical chest pain.  I am most concerned that the  patient's recent unsteadiness was found towards the right and dizziness  may be secondary to a recent cerebrovascular event.  We will therefore  obtain a noncontrast head CT today throughout stroke and also obtain  carotid Dopplers.  The patient's EKG today is benign and I think that an  arrhythmia would be unlikely.  We will also obtain an exercise Myoview  to further risk stratify the patient regarding his coronary artery  disease.  You have recently increased his Lipitor based on his lipid  profile and I think that this was appropriate.  I have therefore made no  other medication changes today as the patient's symptoms of dizziness  have resolved with initiation of Diovan.   PLAN:  1. Head CT and carotid Dopplers.  2. Exercise Myoview.  3. The patient will return to my clinic in 2 weeks for followup.   Thank you for the opportunity of participating in the care of Charles House.  Please feel free to contact me if you wish to discuss his care  further.    Sincerely,      [name], MD  Electronically Signed    JA/MedQ  DD: 11/11/2008  DT: 11/12/2008  Job #: [id number]

## 2011-03-16 NOTE — Consult Note (Signed)
NAME:  Charles House, Charles House              ACCOUNT NO.:  [id number]   MEDICAL RECORD NO.:  [id number]          PATIENT TYPE:  INP   LOCATION:  2001                         FACILITY:  [facility]   PHYSICIAN:  Johnette Abraham, MD    DATE OF BIRTH:  01-10-1941   DATE OF CONSULTATION:  11/15/2008  DATE OF DISCHARGE:                                 CONSULTATION   REASON FOR CONSULTATION:  Possible spider bite to the left index finger.   HISTORY:  Mr. Zingaro is a 70 year old white gentleman who was admitted  for chest pain, and on his work up, it was noticed that he had a red  swollen left index finger.  On questioning the origin of this, the  patient states he was playing golf yesterday using a golf glove and felt  a tingling, when he inserted his hand at some point and afterwards he  noticed that his finger became slightly swollen.  It worsened in the  next 24 hours and it became erythematous at the dorsal aspect of his  hand.  The patient denies any trauma to the finger.  Denies seeing any  actual insect.  He does not recall scraping the finger.  Further  history, the patient is currently being worked up for coronary artery  disease and is scheduled for possible surgeryat the beginningof the  week.   PAST MEDICAL HISTORY:  1. Coronary artery disease.  2. Hypertension.  3. Hyperlipidemia.  4. Atrial fibrillation.   PAST SURGICAL HISTORY:  Right knee arthroscopy.   REVIEW OF SYSTEMS:  CARDIOVASCULAR:  As noted above.  Questionable sleep  apnea.  MUSCULOSKELETAL:  Related to his finger.   His social history, medications, allergies, and family history are  reviewed and are essentially noncontributory.   PHYSICAL EXAMINATION:  VITALS:  The patient's blood pressure is 117/72,  heart rate of 83, respirations 18, 98% on room air.  GENERAL:  He is a alert and oriented, pleasant gentleman who appears his  stated age.  He is resting comfortably in bed.  LUNGS:  Clear.  CARDIOVASCULAR:  Regular rate  and rhythm.  ABDOMEN:  Soft.  EXTREMITY:  His right upper extremity is essentially within normal  limits, his left upper extremity is within normal limits except for the  left index finger where there is evidence of a previous injury to the  nail, distal part of the index finger which is now healed.  Also, the  dorsum of the left index finger at the metacarpophalangeal joint is  swollen and erythematous.  There is a mark that has been drawn around  this  erythema.  Theredoes appear that there is some regression of the  erythema, however, this is not confirmed.  Palpation of the finger  reveals no abscess pocket, but with cellulitis.  Range of motion is  good.  His capillary refill is good. Sensation is grossly intact.   His white cell count is 7000.  There is no x-ray of finger.   ASSESSMENT:  1. Coronary artery disease.  2. Cellulitis of the left index finger, questionable insect bite.   PLAN:  Close observation at this time is warranted.  If the erythema  progresses, broader spectrum antibiotics would be indicated as well as  possible I and D      Johnette Abraham, MD  Electronically Signed     HCC/MEDQ  D:  11/15/2008  T:  11/16/2008  Job:  [id number]

## 2011-03-16 NOTE — Assessment & Plan Note (Signed)
OFFICE VISIT   Charles House, Charles House  DOB:  11-Jul-1941                                        December 12, 2008  CHART #:  [id number]   The patient returns to the office today and after his recent coronary  artery bypass grafting, which was done on November 18, 2008.  The patient  presented with episode of prolonged chest pain with elevated cardiac  enzymes and underwent cardiac catheterization by Dr. Riley Kill.  At the  same time, he also presented with a spider bite on his left index  finger, this is now completely healed.  On November 18, 2008, he  underwent coronary artery bypass grafting x6 with left internal mammary  to the LAD, sequential reverse saphenous vein graft to the second and  third obtuse marginals, and distal circumflex and reverse saphenous vein  graft sequentially to the distal right coronary artery and mid posterior  distending with left cath endovein harvesting.  The patient has made  excellent progress postoperatively.  He has returned to near normal  activities without difficulty, noted some pain in his left leg initially  after getting home, but this is now completely resolved, and he is  ambulating without problems.  He denies any shortness of breath or  recurrent angina.   PHYSICAL EXAMINATION:  On exam, his blood pressure is 122/78, pulse is  65, respiratory rate is 18, and O2 sat is 94%.  His sternum is stable  and well healed.  His chest tube site has a small amount of eschar, but  no active infection.  Both legs are without edema and a small incision  had been made around the right knee, but the vein was not suitable at  this area.  The vein was harvested from the left leg and is now  completely healed without tenderness.   Followup chest x-ray shows clear lung fields bilaterally.   He continues on Toprol 25 daily, aspirin 325 a day, Lipitor 20 a day,  and hydrocodone p.r.n.   Overall, he has made a very good progress.  He is to  start an cardiac  rehab program next week and is gradually returning to work on a limited  basis.   Sheliah Plane, MD  Electronically Signed   EG/MEDQ  D:  12/12/2008  T:  12/12/2008  Job:  [id number]   cc:   [name], M.D.  [name], MD

## 2011-03-16 NOTE — Discharge Summary (Signed)
NAME:  Charles House, Charles House NO.:  [id number]   MEDICAL RECORD NO.:  [id number]          PATIENT TYPE:  INP   LOCATION:  2007                         FACILITY:  [facility]   PHYSICIAN:  Sheliah Plane, MD    DATE OF BIRTH:  [birth year]   DATE OF ADMISSION:  11/15/2008  DATE OF DISCHARGE:  11/23/2008                               DISCHARGE SUMMARY   ADMITTING DIAGNOSES:  1. History of coronary artery disease.  2. History of hypertension.  3. History of hyperlipidemia.  4. History of sleep apnea.  5. Non-ST-elevation myocardial infarction as well as preoperative      paroxysmal atrial fibrillation (conversion to normal sinus rhythm).  6. Left index finger cellulitis.   DISCHARGE DIAGNOSES:  1. History of coronary artery disease.  2. History of hypertension.  3. History of hyperlipidemia.  4. History of sleep apnea.  5. Non-ST-elevation myocardial infarction as well as preoperative      paroxysmal atrial fibrillation (conversion to normal sinus rhythm).  6. Left index finger cellulitis.  7. Thrombocytopenia (last platelet count up to 101,000).   PROCEDURES:  1. Cardiac catheterization done November 15, 2008 by Dr. Riley Kill.  The      patient was found to have a preserved ejection fraction estimated      to be 55-60%, left main diffusely diseased approximately 50% up to      a 70% narrowing in the distal aspect.  The proximal left anterior      descending was segmentally calcified.  There was a 70% eccentric      lesion in the proximal portion of the vessel more distally.  The      proximal vessel of the circumflex had an approximately 50-70%      narrowing as well as an 80% focal eccentric stenosis, and the right      coronary artery was diffusely diseased as well.  2. Coronary artery bypass graft x6 (left internal mammary artery to      left anterior descending, saphenous vein graft to second obtuse      marginal artery, third obtuse marginal artery, distal  circumflex,      saphenous vein graft to distal right coronary artery and mid      posterior descending artery with endoscopic vein harvest of the      entire left leg by Dr. Tyrone Sage on November 18, 2008.   HISTORY OF PRESENTING ILLNESS:  This is a 71 year old Caucasian male  with a past medical history of coronary artery disease, hypertension,  hyperlipidemia, obstructive sleep apnea, was doing relatively well until  approximately 2 weeks ago.  At that time, he had an episode of vertigo,  felt like he was going to fall, and poorly described chest discomfort.  The chest discomfort was not associated with diaphoresis, nauseousness,  or emesis, however.  He originally saw his primary care physician, Dr.  [name] and then saw Dr. [name].  CT scan of the head was  performed that showed no acute abnormalities.  He was then scheduled to  have a carotid duplex scan and stress test on November 15, 2008, however,  the evening of November 14, 2008, he developed increasing chest  discomfort and felt funny.  Apparently, he took his blood pressure and  his systolic blood pressure was greater than 200.  He then went to a  local fire station where he was found to be in atrial fibrillation.  To  increase ventricular rate in the 140s, he was given adenosine.  He  converted to normal sinus rhythm and then was transferred to [facility]  for further evaluation and treatment.  Initial troponin was 0.03, later  became elevated at 0.24.  CK-MB was 6.3 and relative index was 3.3.  The  patient underwent a cardiac catheterization on November 15, 2008, and on  previous day, was found to have multivessel coronary artery disease  (included left main disease).  A cardiothoracic consultation was  obtained with Dr. Tyrone Sage.  Duplex carotid ultrasound revealed no  significant internal carotid artery stenosis bilaterally.  ABIs were  within normal limits.   It should also be noted that when the patient presented  with the  aforementioned symptoms, he also had a complaint of a red swollen left  index finger.  Apparently, he was playing golf the day prior to  admission and when he inserted his hand into his golf glove, apparently  he felt the tingling and then later noticed his finger became swollen.  This worsened over the next 24 hours, and he also noticed erythema at  the dorsal aspect of his left hand.  Upon his admission to [facility]  Emergency Room, a medical consult was obtained with Dr. Izora Ribas.  The  patient was found to have cellulitis of the left index finger.  He was  given Ancef IV with improvement in his symptoms.   BRIEF HOSPITAL COURSE STAY:  The patient was extubated the evening of  surgery.  He was afebrile.  He was apaced.  He did have thrombocytopenia  postoperatively.  His platelets did gradually increase over his hospital  course stay.  Chest tubes were removed by November 19, 2008.  Followup  chest x-ray revealed approximate 5% left pneumothorax.  Last chest x-ray  done on November 21, 2008, revealed a smaller left apical pneumothorax.  The patient was transferred from Intensive Care Unit to 2000 for further  convalescence.  He continued to progress well with Cardiac Rehab, and he  continued to improve such that currently.   PHYSICAL EXAMINATION:  VITAL SIGNS:  On postoperative day #4, he is  afebrile, heart rate is 79, BP 119/75, O2 sat 97% on 2 L nasal cannula.  Preoperative weight 97 kg, today's weight 101.3 kg.  CARDIOVASCULAR:  Regular rate and rhythm.  PULMONARY:  Clear to auscultation bilaterally.  ABDOMEN:  Benign.  EXTREMITIES:  Mild edema.  Both the right lower leg and sternal  incisions are clean and dry.   The patient's epicardial pacing wires were going to be removed today as  well as the Steri-Strips provided he is weaned off oxygen and remains  afebrile and hemodynamically stable.  He will be discharged on November 23, 2008.   LATEST LABORATORY STUDIES:  A  BMET done on November 22, 2008, potassium  3.8, BUN and creatinine were 14 and 1.4 respectively.  Last CBC done on  November 21, 2008 showed H and H to be 9.7 and 28.8, white count 10600,  and platelet count 101,000.  Last chest x-ray done on November 21, 2008,  again showed a slightly smaller tiny left apical pneumothorax,  subsegmental atelectasis  in the right upper lung as well as mild  bibasilar atelectasis, and small bilateral pleural effusions.   DISCHARGE INSTRUCTIONS:  The patient is to remain on low-fat, low-salt  diet.  He is not to drive or lift more than 10 pounds.  He is to  continue with his breathing exercise daily.  He is to walk every day and  increase his frequency and duration as he tolerates.  He may shower.  He  is to cleanse his wounds with mild soap and water.  He is to call the  office if any wound problems arise.   FOLLOWUP APPOINTMENTS:  The patient needs to contact Dr. Jenel Lucks office  for followup appointment in 2 weeks.  The patient has appointment to see  Dr. Tyrone Sage on December 12, 2008 at 10 a.m.  Prior to this office  appointment, a chest x-ray will be obtained.   DISCHARGE MEDICATIONS:  1. ECASA 325 mg p.o. daily.  2. Lipitor 20 mg p.o. at bedtime.  3. Toprol-XL 25 mg p.o. daily.  4. Lasix 40 mg p.o. daily.  5. KCl 20 mEq p.o. daily, both times, 5 days.  6. Oxycodone 5 mg 1-2 tablets every 4-6 hours as needed for pain.      Doree Fudge, [place]      Sheliah Plane, MD  Electronically Signed    DZ/MEDQ  D:  11/22/2008  T:  11/23/2008  Job:  [id number]   cc:   [name], MD  [name], M.D.

## 2011-03-16 NOTE — Consult Note (Signed)
NAME:  GRIER, VU NO.:  [id number]   MEDICAL RECORD NO.:  [id number]          PATIENT TYPE:  INP   LOCATION:  2001                         FACILITY:  [facility]   PHYSICIAN:  Sheliah Plane, MD    DATE OF BIRTH:  [birth year]   DATE OF CONSULTATION:  11/15/2008  DATE OF DISCHARGE:                                 CONSULTATION   REQUESTING PHYSICIAN:  Arturo Morton. Riley Kill, MD, [facility]   FOLLOWUP CARDIOLOGIST:  [name], MD   PRIMARY CARE PHYSICIAN:  [name], MD   REASON FOR CONSULTATION:  Left main coronary artery obstruction, three-  vessel coronary artery disease, and left index finger cellulitis.   HISTORY OF PRESENT ILLNESS:  The patient is a 70 year old male with  known diagnosis of coronary artery obstructive disease having a cardiac  catheterization 9 years ago, but was told he had two 70% blockages and  was treated medically.  He has done well until about 2 weeks ago when he  had a spell of vertigo and feeling like he was going to fall to the  right with some very atypical poorly-described chest discomfort, but  without diaphoresis, nausea, and vomiting.  Because of these symptoms,  he saw Dr. [name] and ultimately saw also Dr. [name] and was  being evaluated.  A CT scan of the head was performed that showed no  acute changes.  He was scheduled to have a carotid duplex scan and a  stress test today.  However, last night, he developed increasing chest  discomfort and feeling funny.  He noted his blood pressure was over  200, went to the nearby power station, was found to be in rapid rhythm,  reportedly atrial fib in the 140s, was given adenosine, converted and  came to the [facility] Emergency Room, and was admitted.  Troponins were  elevated from 0.24 up to 0.58, total CK was 154 with MB of 7.2.  Once  here, he was without symptoms and underwent cardiac catheterization this  morning by Dr. Riley Kill and found to have significant coronary  artery  disease and consultation for consideration of bypass surgery is  requested.  The patient does have a history of hypertension and history  of hyperlipidemia.  He has been on Lipitor for the last 9 years.  He  denies diabetes.  He denies smoking.  No previous history of stroke.  No  claudication.  No history of renal insufficiency.   PAST MEDICAL HISTORY:  1. 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  2. History of sleep apnea.   PREVIOUS SURGICAL HISTORY:  Right knee arthroscopy and tonsillectomy.   SOCIAL HISTORY:  The patient is married, employed as a Education officer, environmental, and  denies alcohol use.   FAMILY HISTORY:  Significant for his father died at age 77 of emphysema  and vascular disease.  Mother had a stroke in the past and died at age  34.   MEDICATIONS:  At the time of admission include Diovan 80 mg a day,  but  he stated this is a new prescription that just started.  He is only  taking a total of 7 doses, and also Lipitor 20 mg a day.   DRUG ALLERGIES:  None known.   REVIEW OF SYSTEMS:  Cardiac review of systems is positive for vague  chest discomfort with episodes of indigestion.  He denies shortness of  breath, either resting or exertional.  He denies orthopnea.  He does  have episodes of presyncope as noted above.  He denies syncope.  He  denies known palpitations prior to last night.  He denies lower  extremity edema.  General review of systems, weight has been stable.  He  denies shortness of breath.  He denies hemoptysis.  He denies amaurosis  or TIAs.  He denies change in bowel habits.  No blood in the stool or  urine.  He has some arthritic changes, especially in his knees.  He does  note that he was playing golf yesterday morning, put his hand in his  golf club, and later in the day his left index finger became swollen and  erythematous.  It now appears more like a bruise.  He has been given a  dose of Ancef.   He denies psychiatric history.  Other review of systems  are negative.   PHYSICAL EXAMINATION:  The patient is awake, alert, and neurologically  intact.  Blood pressure 104/71, heart rate is 86 and sinus, respiratory  rate is 20, temperature is 98.1, and O2 sats 96% on 2 L.  I did not  appreciate any carotid bruits.  Lungs were clear bilaterally.  Cardiac  exam reveals regular rate and rhythm without murmur, rub, or gallop.  Abdominal exam is benign without palpable masses or tenderness.  The  right groin cath site has a dressing.  There was no significant  hematoma.  He has 2+ DP and PT pulses bilaterally.  He appears to have  adequate vein in both lower legs suitable for bypass.  On examination of  the left hand, there is bruising on the dorsum of the left index finger.  It is more dark bluish bruising color than cellulitic erythematous-type  color.  He has good free motion of his joints without significant  tenderness.  The dorsal of his hand is without tenderness or skin  changes.   LABORATORY FINDINGS:  Hematocrit of 45, hemoglobin of 15, white count of  7, platelet count 204, creatinine is 1.32, and potassium 3.5.  As noted,  troponin is 0.58, MB 7.2, and CK 154.  TSH is pending.  PT is 13.1, INR  is 1, and PTT 27.   Cardiac catheterization films were reviewed.  The patient has a proximal  right 60-70%, mid 60%, 60% proximal PD, 78% mid to distal PD, 70% left  main distally long complex, 70% proximal circumflex, proximal 70% right  S3 reasonable-sized obtuse marginal branches suitable for bypass.  Overall, ventricular function is preserved.   IMPRESSION:  The patient with non-Q-wave myocardial infarction with  elevated troponin, severe three-vessel disease now stabilized with  question of cellulitis and left index finger.  With this significant  three-vessel coronary artery disease including left main, I agree with  recommendation to proceed with coronary artery bypass  grafting.  We will  like to observe the finger for several days to make sure there is no  active infection going on, which is reasonable considering the patient  is now asymptomatic.  He at least has had just 1  presentation of atrial  fibrillation associated with ischemia.  At this point, I do not plan to  proceed with maze procedure in addition to bypass surgery.  The risks of surgery including death, infection, stroke, myocardial  infarction, bleeding, and blood transfusion were all discussed with the  patient and his wife in detail and he is willing to proceed.  Tentatively, planned for the afternoon on Monday 18 or Tuesday depending  on scheduling.      Sheliah Plane, MD  Electronically Signed     EG/MEDQ  D:  11/15/2008  T:  11/16/2008  Job:  [id number]   cc:   [name], MD  [address]  [place] [address]   Arturo Morton. Riley Kill, MD, [facility]  [address]  [address]  [place]  [place] [address]   [name], M.D.  Fax: [phone]

## 2011-03-16 NOTE — Assessment & Plan Note (Signed)
[facility]                                 ON-CALL NOTE   NAME:WRIGHTSoren, Lazarz                       MRN:          [id number]  DATE:11/23/2008                            DOB:          [birth year]    CARDIOLOGIST:  [name], MD.   PATIENT'S PHONE NUMBER:  [phone].   HISTORY:  Mr. Titzer is a 70 year old male with history of 3-vessel CAD,  who recently underwent bypass surgery on November 18, 2008, with Dr.  Tyrone Sage.  He was discharged to home today.  He called the Answering  Service with some concerns over his medications as well as fever and  swelling in his left leg.  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  He has noted a significant amount of ecchymosis and  swelling especially at his upper thigh near his groin.  He has also  noted a temperature of 100.5.   PLAN:  I explained to Mr. Dorian that I think most of his symptoms are  secondary to his recent surgery.  I think he should get in touch with  the surgeons tonight to get further instructions.  I have provided him  with the phone number for Dr. Dennie Maizes office.   DISPOSITION:  As outlined above.  The patient will contact Dr.  Dennie Maizes office.  If he is unable to reach anyone, he is to call me  back tonight.     Tereso Newcomer, PA-C  Electronically Signed    SW/MedQ  DD: 11/23/2008  DT: 11/23/2008  Job #: [id number]

## 2011-03-16 NOTE — Cardiovascular Report (Signed)
NAME:  Charles House, Charles House NO.:  [id number]   MEDICAL RECORD NO.:  [id number]          PATIENT TYPE:  INP   LOCATION:  2001                         FACILITY:  [facility]   PHYSICIAN:  Arturo Morton. Riley Kill, MD, FACCDATE OF BIRTH:  10/06/1941   DATE OF PROCEDURE:  11/15/2008  DATE OF DISCHARGE:                            CARDIAC CATHETERIZATION   INDICATIONS:  The patient is a 70 year old gentleman who presents with  chest pain and atrial fibrillation.  His atrial fibrillation resolved.  His current study is done to assess coronary anatomy.   PROCEDURE:  1. Left heart catheterization.  2. Selective coronary arteriography.  3. Selective left ventriculography.  4. Subclavian angiography.   DESCRIPTION OF PROCEDURE:  The patient was brought to the cath lab,  prepped and draped in the usual fashion.  Through an anterior puncture,  the femoral artery was entered.  A 5-French sheath was placed.  Views of  left and right coronary arteries were then obtained in multiple  angiographic projections.  Central aortic and left ventricular pressures  were measured with pigtail.  Ventriculography was performed in the RAO  projection.  We also took a picture of the subclavian because of the  patient's need for revascularization surgery.  He tolerated all of this  well, 2 mg of intravenous Versed were given for sedation.  He was taken  to the holding area in satisfactory clinical condition for sheath  removal.  I spoke with his family in detail.  Surgical consultation was  obtained.  During the course of the procedure, we did give IV fluids at  500 mL for a grand total of 250 mL of normal saline.  This was because  of borderline blood pressure.   HEMODYNAMIC DATA:  1. Initial central aortic pressure was 88/61, mean 73.  2. Left ventricular pressure 92/11.  3. There was no gradient pullback across the aortic valve.   ANGIOGRAPHIC DATA:  1. Ventriculography was done in the RAO projection.   Overall systolic      function is preserved.  Ejection fraction was felt to be in excess      of 55-60%.  2. The subclavian appears to be patent with the internal mammary      patent and usable for graft.  3. There is heavy calcification of coronary arteries.  4. The left main is diffusely diseased.  It probably is 50% diffusely      diseased with irregularity along its entire course and up to      eccentric 70% narrowing involving the distal-most aspect as it rose      into the proximal circumflex and proximal LAD.  5. The proximal LAD is segmentally calcified.  In the LAO view, there      is at least 170% eccentric lesion in the proximal portion of the      vessel more distally, there is about 40% after takeoff of two      septal perforators.  6. The circumflex is diffusely diseased proximally as previously      noted.  The whole proximal vessel was probably 50-70% narrow.  There is then about an 80% area of focal eccentric stenosis.      Distal to this is a moderate marginal branch that is somewhat      larger marginal branch.  Distally to that, there are two more      marginal branches that demonstrate bifurcational disease of 70 and      80 and these vessels are large in caliber and both suitable for      grafting.  7. The right coronary artery is diffusely diseased proximally with 60%      narrowing.  The vessel then opens up into its normal caliber, then      there is diffuse 50% narrowing.  The proximal PDA has some plaquing      at the ostium of about 60% with about 50% mid-narrowing as well.   CONCLUSIONS:  1. Preserved left ventricular function.  2. Patent internal mammary.  3. Diffuse calcified segmental disease of the left main, proximal      circumflex, and proximal LAD as noted above with high-grade mid      circumflex disease and moderate bifurcational distal circumflex      disease.  4. Diffuse right coronary artery disease as noted above.   DISPOSITION:  The  diffuse left main disease involving the bifurcation  with calcification is worrisome for percutaneous intervention.  None of  this is ideal.  Given the options on the patient's presentation, he  would be best served with improved survival with revascularization  surgery.  This has been explained to him.  Surgical consultation was  obtained.      Arturo Morton. Riley Kill, MD, [facility]  Electronically Signed     TDS/MEDQ  D:  11/15/2008  T:  11/16/2008  Job:  [id number]

## 2011-03-16 NOTE — Assessment & Plan Note (Signed)
[facility]                            CARDIOLOGY OFFICE NOTE   NAME:WRIGHTKiren, Mcisaac                       MRN:          [id number]  DATE:12/10/2008                            DOB:          [birth year]    INTRODUCTION:  Mr. Charles House is a pleasant 70 year old gentleman with a  history of coronary artery disease, status post 6-vessel CABG on November 18, 2008, who presents today for followup.   PROBLEM LIST:  1. Coronary artery disease, status post 6-vessel CABG on November 18, 2008, with a LIMA to LAD, SVG sequential to second obtuse marginal,      third obtuse marginal and distal circumflex, reverse saphenous vein      graft sequential to the distal right coronary artery, and the mid      posterior descending coronary artery, and left internal mammary to      the left anterior descending.  2. Hyperlipidemia.  3. Symptoms of sleep apnea.  4. Hypertension.  5. Paroxysmal atrial fibrillation.   MEDICATIONS:  1. Aspirin 325 mg daily.  2. Lipitor 20 mg daily.  3. Toprol XL 25 mg daily.  4. Colace 100 mg at bedtime.   INTERVAL HISTORY:  Rev. Engelbert presents today for cardiology followup  after a recent hospitalization with coronary artery bypass grafting  performed by Dr. Tyrone Sage on November 18, 2008.  He had initially  developed a spider bite to the hand with subsequent symptomatic atrial  fibrillation and rapid ventricular rates.  He was admitted to [facility] where he ruled in for myocardial infarction.  An echocardiogram  revealed a preserved ejection fraction of 55-60%.  Left heart  catheterization revealed diffuse calcification of the left main,  proximal circumflex, and proximal LAD arteries, as well as diffuse right  coronary artery disease.  He underwent 6-vessel CABG and has done well  since that time.   Since hospital discharge, Rev. Aldape has continued to have improvement  overall.  He denies chest pain, shortness of  breath, orthopnea, PND,  palpitations, presyncope, or syncope.  He has had typical postoperative  pain within his left leg at the harvest sites.  His spouse notices that  he has restless leg syndrome frequently as well as symptoms of snoring  with apnea and they are concerned about the possible diagnosis of sleep  apnea.  He plans to start cardiac rehab next week.  He is otherwise  without complaint today.   PHYSICAL EXAMINATION:  VITAL SIGNS:  Blood pressure 110/69, heart rate  76, respirations 18, and weight 204 pounds.  GENERAL:  The patient is a well-appearing male in no acute distress.  He  is alert and oriented x3.  HEENT:  Normocephalic and atraumatic.  Sclerae clear.  Conjunctivae  pink.  Oropharynx clear.  NECK:  Supple.  No thyromegaly, lymphadenopathy, JVD, or bruits.  LUNGS:  Clear to auscultation bilaterally.  HEART:  Regular rate and rhythm.  No murmurs, rubs, or gallops.  GI:  Soft, nontender, and nondistended.  Positive bowel sounds.  EXTREMITIES:  No clubbing, cyanosis, or edema.  SKIN:  The patient's midline thoracotomy scar appears to be healing  nicely.  The chest tube site is healing slowly without any evidence of  infection.  The patient's left and right leg harvest sites appeared to  be healing nicely.  NEUROLOGIC:  Nonfocal.   EKG today revealed sinus rhythm at 76 beats per minute with nonspecific  ST-T wave changes.   IMPRESSION:  Rev. Daniello is a pleasant 70 year old gentleman with a  history of coronary artery disease, hypertension, hyperlipidemia, and  symptoms with sleep apnea.  He is recovering nicely following his recent  CABG surgery, which was performed on November 18, 2008.  He has scheduled  followup with Dr. Tyrone Sage later this week.  His blood pressure is  currently well controlled and he is treated with Lipitor for his  hyperlipidemia.  He did have symptomatic episode of atrial fibrillation,  which precipitated his recent hospitalization.   However, he has had no  further symptomatic episodes of atrial fibrillation.  His CHADS2 score  is 1, and therefore he has been maintained on aspirin therapy alone.  Due to symptoms of sleep apnea, I think that the sleep study should be  performed.   PLAN:  1. No medication changes today.  2. Sleep study will be performed.  3. We will initiate cardiac rehab next week.  4. The patient will return to clinic for further assessment in 2      months.     [name], MD  Electronically Signed    JA/MedQ  DD: 12/10/2008  DT: 12/11/2008  Job #: [id number]   cc:   Sheliah Plane, MD  Melida Quitter, M.D.

## 2011-08-02 ENCOUNTER — Encounter: Payer: Self-pay | Admitting: Internal Medicine

## 2011-08-04 ENCOUNTER — Ambulatory Visit (INDEPENDENT_AMBULATORY_CARE_PROVIDER_SITE_OTHER): Payer: Medicare Other | Admitting: Internal Medicine

## 2011-08-04 ENCOUNTER — Encounter: Payer: Self-pay | Admitting: Internal Medicine

## 2011-08-04 DIAGNOSIS — I2510 Atherosclerotic heart disease of native coronary artery without angina pectoris: Secondary | ICD-10-CM

## 2011-08-04 DIAGNOSIS — I6523 Occlusion and stenosis of bilateral carotid arteries: Secondary | ICD-10-CM

## 2011-08-04 DIAGNOSIS — E785 Hyperlipidemia, unspecified: Secondary | ICD-10-CM

## 2011-08-04 DIAGNOSIS — I6529 Occlusion and stenosis of unspecified carotid artery: Secondary | ICD-10-CM

## 2011-08-04 DIAGNOSIS — I10 Essential (primary) hypertension: Secondary | ICD-10-CM

## 2011-08-04 LAB — LIPID PANEL
LDL Cholesterol: 89 mg/dL (ref 0–99)
VLDL: 10.6 mg/dL (ref 0.0–40.0)

## 2011-08-04 LAB — HEPATIC FUNCTION PANEL
ALT: 19 U/L (ref 0–53)
AST: 18 U/L (ref 0–37)
Albumin: 4.1 g/dL (ref 3.5–5.2)
Alkaline Phosphatase: 85 U/L (ref 39–117)

## 2011-08-04 NOTE — Assessment & Plan Note (Signed)
Asymptomatic Check fasting lipids and LFTs today  Continue ASA and lipitor Has not tolerated Beta blockers

## 2011-08-04 NOTE — Assessment & Plan Note (Signed)
Fasting lipids and LFTs   Prior cardotid US revealed bilateral stenosis 0-39%,  We will repeat study,  If stable, no further testing required.

## 2011-08-04 NOTE — Assessment & Plan Note (Signed)
Controlled off of medicine

## 2011-08-04 NOTE — Progress Notes (Signed)
The patient presents today for routine electrophysiology followup.  Since last being seen in our clinic, the patient reports doing very well.   He remains very active.  Today, he denies symptoms of palpitations, chest pain, shortness of breath, orthopnea, PND, lower extremity edema, dizziness, presyncope, syncope, or neurologic sequela.  The patient feels that he is tolerating medications without difficulties and is otherwise without complaint today.   Past Medical History  Diagnosis Date  . Coronary artery disease     s/p CABG 11/18/08  . Hyperlipidemia   . Hypertension   . Sleep apnea     moderate by prior sleep study, symptoms resolved with weight loss  . Atrial fibrillation     once in setting of a spider bite 1/10, without recurrence  . Carotid artery occlusion     0-39% stenosis by [place] 4/11   Past Surgical History  Procedure Date  . Tonsillectomy   . Knee arthroscopy   . Coronary artery bypass graft 1/10    by Dr Tyrone Sage    Current Outpatient Prescriptions  Medication Sig Dispense Refill  . aspirin 325 MG tablet Take 325 mg by mouth daily.        Marland Kitchen atorvastatin (LIPITOR) 20 MG tablet Take 1 tablet (20 mg total) by mouth daily.  30 tablet  6  . Cholecalciferol (VITAMIN D-3 PO) Take by mouth.          No Known Allergies  History   Social History  . Marital Status: Married    Spouse Name: N/A    Number of Children: N/A  . Years of Education: N/A   Occupational History  . Not on file.   Social History Main Topics  . Smoking status: Never Smoker   . Smokeless tobacco: Never Used  . Alcohol Use: No  . Drug Use: No  . Sexually Active: Not on file   Other Topics Concern  . Not on file   Social History Narrative   Programmer, multimedia,  Retired from [employer], now owns a Arts development officer.    Physical Exam: Filed Vitals:   08/04/11 0959  BP: 129/77  Pulse: 67  Height: 5\' 10"  (1.778 m)  Weight: 180 lb (81.647 kg)    GEN- The patient is well appearing, alert and  oriented x 3 today.   Head- normocephalic, atraumatic Eyes-  Sclera clear, conjunctiva pink Ears- hearing intact Oropharynx- clear Neck- supple, no JVP Lymph- no cervical lymphadenopathy Lungs- Clear to ausculation bilaterally, normal work of breathing Heart- Regular rate and rhythm, no murmurs, rubs or gallops, PMI not laterally displaced GI- soft, NT, ND, + BS Extremities- no clubbing, cyanosis, or edema MS- no significant deformity or atrophy Skin- no rash or lesion Psych- euthymic mood, full affect Neuro- strength and sensation are intact  ekg today reveals sinus rhythm 62 bpm, nonspecific ST/T changes  Assessment and Plan:

## 2011-08-04 NOTE — Patient Instructions (Addendum)
Your physician wants you to follow-up in: 12 months with Dr Jacquiline Doe will receive a reminder letter in the mail two months in advance. If you don't receive a letter, please call our office to schedule the follow-up appointment.   Your physician recommends that you return for lab work in: lipid/liver  Your physician has requested that you have a carotid duplex. This test is an ultrasound of the carotid arteries in your neck. It looks at blood flow through these arteries that supply the brain with blood. Allow one hour for this exam. There are no restrictions or special instructions.

## 2011-09-06 ENCOUNTER — Encounter (INDEPENDENT_AMBULATORY_CARE_PROVIDER_SITE_OTHER): Payer: Medicare Other | Admitting: *Deleted

## 2011-09-06 DIAGNOSIS — I6529 Occlusion and stenosis of unspecified carotid artery: Secondary | ICD-10-CM

## 2011-09-06 DIAGNOSIS — I6523 Occlusion and stenosis of bilateral carotid arteries: Secondary | ICD-10-CM

## 2011-10-20 ENCOUNTER — Other Ambulatory Visit: Payer: Self-pay | Admitting: Internal Medicine

## 2011-10-20 NOTE — Telephone Encounter (Signed)
Fax Received. Refill Completed. Charles House (R.M.A)   

## 2012-05-23 ENCOUNTER — Other Ambulatory Visit: Payer: Self-pay | Admitting: *Deleted

## 2012-05-23 MED ORDER — ATORVASTATIN CALCIUM 20 MG PO TABS: 20.0000 mg | ORAL_TABLET | Freq: Every day | ORAL | Status: DC

## 2012-05-25 ENCOUNTER — Other Ambulatory Visit: Payer: Self-pay | Admitting: Internal Medicine

## 2012-10-09 ENCOUNTER — Other Ambulatory Visit: Payer: Self-pay | Admitting: Cardiology

## 2012-10-09 DIAGNOSIS — I6529 Occlusion and stenosis of unspecified carotid artery: Secondary | ICD-10-CM

## 2012-10-11 ENCOUNTER — Ambulatory Visit (INDEPENDENT_AMBULATORY_CARE_PROVIDER_SITE_OTHER): Payer: Medicare Other | Admitting: Internal Medicine

## 2012-10-11 ENCOUNTER — Encounter: Payer: Self-pay | Admitting: Internal Medicine

## 2012-10-11 ENCOUNTER — Encounter (INDEPENDENT_AMBULATORY_CARE_PROVIDER_SITE_OTHER): Payer: Medicare Other

## 2012-10-11 VITALS — BP 132/81 | HR 60 | Resp 18 | Ht 70.0 in | Wt 193.0 lb

## 2012-10-11 DIAGNOSIS — E785 Hyperlipidemia, unspecified: Secondary | ICD-10-CM

## 2012-10-11 DIAGNOSIS — I4891 Unspecified atrial fibrillation: Secondary | ICD-10-CM

## 2012-10-11 DIAGNOSIS — I6529 Occlusion and stenosis of unspecified carotid artery: Secondary | ICD-10-CM

## 2012-10-11 NOTE — Progress Notes (Signed)
PCP: [name], MD  Charles House is a 71 y.o. male who presents today for routine cardiology followup.  Since last being seen in our clinic, the patient reports doing very well. He remains active without ischemic symptoms.  He is unaware of any afib.  Today, he denies symptoms of palpitations, chest pain, shortness of breath,  lower extremity edema, dizziness, presyncope, or syncope.  The patient is otherwise without complaint today.   Past Medical History  Diagnosis Date  . Coronary artery disease     s/p CABG 11/18/08  . Hyperlipidemia   . Hypertension   . Sleep apnea     moderate by prior sleep study, symptoms resolved with weight loss  . Atrial fibrillation     once in setting of a spider bite 1/10, without recurrence  . Carotid artery occlusion     0-39% stenosis by [place] 4/11   Past Surgical History  Procedure Date  . Tonsillectomy   . Knee arthroscopy   . Coronary artery bypass graft 1/10    by Dr Tyrone Sage    Current Outpatient Prescriptions  Medication Sig Dispense Refill  . aspirin 325 MG tablet Take 325 mg by mouth daily.        Marland Kitchen atorvastatin (LIPITOR) 20 MG tablet Take 1 tablet (20 mg total) by mouth daily.  30 tablet  9  . Cholecalciferol (VITAMIN D-3 PO) Take by mouth.          Physical Exam: Filed Vitals:   10/11/12 1605  BP: 132/81  Pulse: 60  Resp: 18  Height: 5\' 10"  (1.778 m)  Weight: 193 lb (87.544 kg)    GEN- The patient is well appearing, alert and oriented x 3 today.   Head- normocephalic, atraumatic Eyes-  Sclera clear, conjunctiva pink Ears- hearing intact Oropharynx- clear Lungs- Clear to ausculation bilaterally, normal work of breathing Heart- Regular rate and rhythm, no murmurs, rubs or gallops, PMI not laterally displaced GI- soft, NT, ND, + BS Extremities- no clubbing, cyanosis, or edema  ekg today reveals sinus rhythm 58 bpm, otherwise normal ekg  Assessment and Plan:   CORONARY ARTERY DISEASE  Asymptomatic  Check fasting  lipids and LFTs today  Continue ASA and lipitor  Has not tolerated Beta blockers previously  HYPERLIPIDEMIA  Fasting lipids and LFTs today Prior cardotid US revealed bilateral stenosis 0-39%, We will repeat study today, If stable, no further testing required.   Return in 1 year

## 2012-10-11 NOTE — Patient Instructions (Signed)
Your physician wants you to follow-up in: 12 months with Dr Jacquiline Doe will receive a reminder letter in the mail two months in advance. If you don't receive a letter, please call our office to schedule the follow-up appointment.  Your physician recommends that you return for lab work drawn today (lipid and liver profile)

## 2012-10-12 LAB — HEPATIC FUNCTION PANEL
ALT: 24 U/L (ref 0–53)
Alkaline Phosphatase: 84 U/L (ref 39–117)
Bilirubin, Direct: 0.2 mg/dL (ref 0.0–0.3)
Total Bilirubin: 1.9 mg/dL — ABNORMAL HIGH (ref 0.3–1.2)
Total Protein: 7.6 g/dL (ref 6.0–8.3)

## 2012-10-12 LAB — LIPID PANEL
Cholesterol: 157 mg/dL (ref 0–200)
LDL Cholesterol: 90 mg/dL (ref 0–99)

## 2013-08-04 ENCOUNTER — Other Ambulatory Visit: Payer: Self-pay | Admitting: Internal Medicine

## 2013-10-17 ENCOUNTER — Encounter: Payer: Self-pay | Admitting: Internal Medicine

## 2013-10-17 ENCOUNTER — Ambulatory Visit (INDEPENDENT_AMBULATORY_CARE_PROVIDER_SITE_OTHER): Payer: Medicare Other | Admitting: Internal Medicine

## 2013-10-17 VITALS — BP 128/82 | HR 59 | Ht 70.0 in | Wt 190.4 lb

## 2013-10-17 DIAGNOSIS — I4891 Unspecified atrial fibrillation: Secondary | ICD-10-CM

## 2013-10-17 DIAGNOSIS — E7800 Pure hypercholesterolemia, unspecified: Secondary | ICD-10-CM

## 2013-10-17 LAB — HEPATIC FUNCTION PANEL
ALT: 30 U/L (ref 0–53)
Alkaline Phosphatase: 87 U/L (ref 39–117)
Bilirubin, Direct: 0.2 mg/dL (ref 0.0–0.3)
Total Protein: 7.7 g/dL (ref 6.0–8.3)

## 2013-10-17 LAB — LIPID PANEL
Cholesterol: 168 mg/dL (ref 0–200)
HDL: 53.1 mg/dL (ref >39.00)
Triglycerides: 65 mg/dL (ref 0.0–149.0)

## 2013-10-17 NOTE — Progress Notes (Signed)
PCP: [name], MD  Charles House is a 72 y.o. male who presents today for routine cardiology followup.  Since last being seen in our clinic, the patient reports doing very well. He remains active without ischemic symptoms.  He is unaware of any afib.  Today, he denies symptoms of palpitations, chest pain, shortness of breath,  lower extremity edema, dizziness, presyncope, or syncope.  The patient is otherwise without complaint today.   Past Medical History  Diagnosis Date  . Coronary artery disease     s/p CABG 11/18/08  . Hyperlipidemia   . Hypertension   . Sleep apnea     moderate by prior sleep study, symptoms resolved with weight loss  . Atrial fibrillation     once in setting of a spider bite 1/10, without recurrence  . Carotid artery occlusion     0-39% stenosis by [place] 4/11   Past Surgical History  Procedure Laterality Date  . Tonsillectomy    . Knee arthroscopy    . Coronary artery bypass graft  1/10    by Dr Tyrone Sage    Current Outpatient Prescriptions  Medication Sig Dispense Refill  . aspirin 325 MG tablet Take 325 mg by mouth daily.        Marland Kitchen atorvastatin (LIPITOR) 20 MG tablet TAKE 1 TABLET BY MOUTH EVERY DAY  90 tablet  0  . Cholecalciferol (VITAMIN D-3 PO) Take 2,000 Units by mouth.        No current facility-administered medications for this visit.    Physical Exam: Filed Vitals:   10/17/13 0917  BP: 128/82  Pulse: 59  Height: 5\' 10"  (1.778 m)  Weight: 190 lb 6.4 oz (86.365 kg)    GEN- The patient is well appearing, alert and oriented x 3 today.   Head- normocephalic, atraumatic Eyes-  Sclera clear, conjunctiva pink Ears- hearing intact Oropharynx- clear Lungs- Clear to ausculation bilaterally, normal work of breathing Heart- Regular rate and rhythm, no murmurs, rubs or gallops, PMI not laterally displaced GI- soft, NT, ND, + BS Extremities- no clubbing, cyanosis, or edema  ekg today reveals sinus rhythm 58 bpm, otherwise normal  ekg  Assessment and Plan:   CORONARY ARTERY DISEASE  Asymptomatic  Check fasting lipids and LFTs today  Continue ASA and lipitor  Has not tolerated Beta blockers previously  HYPERLIPIDEMIA  Fasting lipids and LFTs today Prior cardotid US revealed bilateral stenosis 0-39%, no further workup is required   Return in 1 year

## 2013-10-17 NOTE — Patient Instructions (Signed)
Your physician wants you to follow-up in 12 months with Dr Jacquiline Doe will receive a reminder letter in the mail two months in advance. If you don't receive a letter, please call our office to schedule the follow-up appointment.   Your physician recommends that you return for lab work today: Lipid/Liver

## 2014-07-11 ENCOUNTER — Telehealth: Payer: Self-pay | Admitting: Internal Medicine

## 2014-07-11 NOTE — Telephone Encounter (Signed)
Received request from Nurse fax box, documents faxed for surgical clearance. To: [facility] Fax number: [phone] Attention: [phone]/km

## 2014-10-23 ENCOUNTER — Other Ambulatory Visit (HOSPITAL_COMMUNITY): Payer: Self-pay | Admitting: Cardiology

## 2014-10-23 DIAGNOSIS — I6523 Occlusion and stenosis of bilateral carotid arteries: Secondary | ICD-10-CM

## 2014-11-11 ENCOUNTER — Ambulatory Visit (HOSPITAL_COMMUNITY): Payer: Medicare HMO | Attending: Internal Medicine | Admitting: Cardiology

## 2014-11-11 DIAGNOSIS — I6523 Occlusion and stenosis of bilateral carotid arteries: Secondary | ICD-10-CM | POA: Diagnosis present

## 2014-11-11 DIAGNOSIS — R0989 Other specified symptoms and signs involving the circulatory and respiratory systems: Secondary | ICD-10-CM | POA: Diagnosis not present

## 2014-11-11 DIAGNOSIS — Z951 Presence of aortocoronary bypass graft: Secondary | ICD-10-CM | POA: Diagnosis not present

## 2014-11-11 DIAGNOSIS — E785 Hyperlipidemia, unspecified: Secondary | ICD-10-CM | POA: Diagnosis not present

## 2014-11-11 DIAGNOSIS — I739 Peripheral vascular disease, unspecified: Secondary | ICD-10-CM | POA: Diagnosis not present

## 2014-11-11 DIAGNOSIS — I10 Essential (primary) hypertension: Secondary | ICD-10-CM | POA: Insufficient documentation

## 2014-11-11 DIAGNOSIS — I2510 Atherosclerotic heart disease of native coronary artery without angina pectoris: Secondary | ICD-10-CM | POA: Insufficient documentation

## 2014-11-11 NOTE — Progress Notes (Signed)
Carotid duplex performed 

## 2014-12-02 ENCOUNTER — Telehealth: Payer: Self-pay | Admitting: Internal Medicine

## 2014-12-02 NOTE — Telephone Encounter (Signed)
Called lmom looks good no need to repeat for 2 years

## 2014-12-02 NOTE — Telephone Encounter (Signed)
Follow Up ° °Pt called back for results °

## 2015-01-23 ENCOUNTER — Encounter (HOSPITAL_COMMUNITY): Payer: Self-pay | Admitting: Neurology

## 2015-01-23 ENCOUNTER — Encounter (HOSPITAL_COMMUNITY)
Admission: EM | Disposition: A | Discharge status: Home / Self Care | Payer: Medicare HMO | Source: Home / Self Care | Attending: Cardiovascular Disease

## 2015-01-23 ENCOUNTER — Inpatient Hospital Stay (HOSPITAL_COMMUNITY)
Admission: EM | Admit: 2015-01-23 | Discharge: 2015-01-24 | DRG: 247 | Disposition: A | Discharge status: Home / Self Care | Payer: Medicare HMO | Attending: Cardiovascular Disease | Admitting: Cardiovascular Disease

## 2015-01-23 ENCOUNTER — Emergency Department (HOSPITAL_COMMUNITY): Payer: Medicare HMO

## 2015-01-23 DIAGNOSIS — Z951 Presence of aortocoronary bypass graft: Secondary | ICD-10-CM | POA: Diagnosis not present

## 2015-01-23 DIAGNOSIS — I10 Essential (primary) hypertension: Secondary | ICD-10-CM | POA: Diagnosis present

## 2015-01-23 DIAGNOSIS — I25700 Atherosclerosis of coronary artery bypass graft(s), unspecified, with unstable angina pectoris: Secondary | ICD-10-CM | POA: Diagnosis not present

## 2015-01-23 DIAGNOSIS — R001 Bradycardia, unspecified: Secondary | ICD-10-CM | POA: Diagnosis present

## 2015-01-23 DIAGNOSIS — G4733 Obstructive sleep apnea (adult) (pediatric): Secondary | ICD-10-CM | POA: Diagnosis present

## 2015-01-23 DIAGNOSIS — I2111 ST elevation (STEMI) myocardial infarction involving right coronary artery: Secondary | ICD-10-CM | POA: Diagnosis not present

## 2015-01-23 DIAGNOSIS — I2119 ST elevation (STEMI) myocardial infarction involving other coronary artery of inferior wall: Principal | ICD-10-CM | POA: Diagnosis present

## 2015-01-23 DIAGNOSIS — R079 Chest pain, unspecified: Secondary | ICD-10-CM | POA: Diagnosis present

## 2015-01-23 DIAGNOSIS — I2510 Atherosclerotic heart disease of native coronary artery without angina pectoris: Secondary | ICD-10-CM | POA: Diagnosis not present

## 2015-01-23 DIAGNOSIS — R739 Hyperglycemia, unspecified: Secondary | ICD-10-CM | POA: Diagnosis present

## 2015-01-23 DIAGNOSIS — E785 Hyperlipidemia, unspecified: Secondary | ICD-10-CM | POA: Diagnosis present

## 2015-01-23 DIAGNOSIS — I480 Paroxysmal atrial fibrillation: Secondary | ICD-10-CM | POA: Diagnosis present

## 2015-01-23 HISTORY — PX: LEFT HEART CATHETERIZATION WITH CORONARY ANGIOGRAM: SHX5451

## 2015-01-23 HISTORY — DX: Hyperglycemia, unspecified: R73.9

## 2015-01-23 HISTORY — PX: PERCUTANEOUS CORONARY STENT INTERVENTION (PCI-S): SHX5485

## 2015-01-23 HISTORY — DX: Bradycardia, unspecified: R00.1

## 2015-01-23 LAB — COMPREHENSIVE METABOLIC PANEL
ALK PHOS: 75 U/L (ref 39–117)
ALT: 22 U/L (ref 0–53)
AST: 24 U/L (ref 0–37)
Albumin: 3.8 g/dL (ref 3.5–5.2)
Anion gap: 12 (ref 5–15)
BUN: 21 mg/dL (ref 6–23)
CO2: 22 mmol/L (ref 19–32)
Calcium: 9.1 mg/dL (ref 8.4–10.5)
Chloride: 105 mmol/L (ref 96–112)
Creatinine, Ser: 0.96 mg/dL (ref 0.50–1.35)
GFR calc Af Amer: >90 mL/min (ref >90)
GFR calc non Af Amer: 81 mL/min — ABNORMAL LOW (ref >90)
Glucose, Bld: 176 mg/dL — ABNORMAL HIGH (ref 70–99)
POTASSIUM: 3.9 mmol/L (ref 3.5–5.1)
SODIUM: 139 mmol/L (ref 135–145)
TOTAL PROTEIN: 6.7 g/dL (ref 6.0–8.3)
Total Bilirubin: 0.9 mg/dL (ref 0.3–1.2)

## 2015-01-23 LAB — PROTIME-INR
INR: 1.02 (ref 0.00–1.49)
INR: 1.23 (ref 0.00–1.49)
PROTHROMBIN TIME: 13.5 s (ref 11.6–15.2)
PROTHROMBIN TIME: 15.7 s — AB (ref 11.6–15.2)

## 2015-01-23 LAB — TROPONIN I
TROPONIN I: 19.6 ng/mL — AB (ref <0.031)
TROPONIN I: 23.66 ng/mL — AB (ref <0.031)
Troponin I: 4.34 ng/mL (ref <0.031)

## 2015-01-23 LAB — CBC
HCT: 45 % (ref 39.0–52.0)
Hemoglobin: 15.2 g/dL (ref 13.0–17.0)
MCH: 30.4 pg (ref 26.0–34.0)
MCHC: 33.8 g/dL (ref 30.0–36.0)
MCV: 90 fL (ref 78.0–100.0)
PLATELETS: 181 10*3/uL (ref 150–400)
RBC: 5 MIL/uL (ref 4.22–5.81)
RDW: 13 % (ref 11.5–15.5)
WBC: 9.7 10*3/uL (ref 4.0–10.5)

## 2015-01-23 LAB — POCT I-STAT, CHEM 8
BUN: 18 mg/dL (ref 6–23)
Calcium, Ion: 1.04 mmol/L — ABNORMAL LOW (ref 1.13–1.30)
Chloride: 71 mmol/L — ABNORMAL LOW (ref 96–112)
Creatinine, Ser: 0.3 mg/dL — ABNORMAL LOW (ref 0.50–1.35)
Glucose, Bld: 88 mg/dL (ref 70–99)
HCT: 43 % (ref 39.0–52.0)
HEMOGLOBIN: 14.6 g/dL (ref 13.0–17.0)
POTASSIUM: 2.8 mmol/L — AB (ref 3.5–5.1)
SODIUM: 107 mmol/L — AB (ref 135–145)
TCO2: 14 mmol/L (ref 0–100)

## 2015-01-23 LAB — TSH: TSH: 2.152 u[IU]/mL (ref 0.350–4.500)

## 2015-01-23 LAB — MRSA PCR SCREENING: MRSA by PCR: NEGATIVE

## 2015-01-23 LAB — POCT ACTIVATED CLOTTING TIME: Activated Clotting Time: 435 seconds

## 2015-01-23 LAB — POCT I-STAT TROPONIN I: Troponin i, poc: 0.13 ng/mL (ref 0.00–0.08)

## 2015-01-23 LAB — APTT: APTT: 23 s — AB (ref 24–37)

## 2015-01-23 LAB — BRAIN NATRIURETIC PEPTIDE: B Natriuretic Peptide: 75.4 pg/mL (ref 0.0–100.0)

## 2015-01-23 SURGERY — LEFT HEART CATHETERIZATION WITH CORONARY ANGIOGRAM
Anesthesia: LOCAL

## 2015-01-23 MED ORDER — FENTANYL CITRATE 0.05 MG/ML IJ SOLN
70.0000 ug | Freq: Once | INTRAMUSCULAR | Status: AC
  Administered 2015-01-23: 70 ug via INTRAVENOUS
  Filled 2015-01-23: qty 2

## 2015-01-23 MED ORDER — TICAGRELOR 90 MG PO TABS
ORAL_TABLET | ORAL | Status: AC
  Filled 2015-01-23: qty 1

## 2015-01-23 MED ORDER — SODIUM CHLORIDE 0.9 % IJ SOLN: 3.0000 mL | INTRAMUSCULAR | Status: DC | PRN

## 2015-01-23 MED ORDER — TICAGRELOR 90 MG PO TABS
ORAL_TABLET | ORAL | Status: AC
  Administered 2015-01-24: 90 mg via ORAL
  Filled 2015-01-23: qty 1

## 2015-01-23 MED ORDER — SODIUM CHLORIDE 0.9 % IJ SOLN
3.0000 mL | Freq: Two times a day (BID) | INTRAMUSCULAR | Status: DC
  Administered 2015-01-23 – 2015-01-24 (×2): 3 mL via INTRAVENOUS

## 2015-01-23 MED ORDER — MORPHINE SULFATE 2 MG/ML IJ SOLN: 2.0000 mg | INTRAMUSCULAR | Status: DC | PRN

## 2015-01-23 MED ORDER — ONDANSETRON HCL 4 MG/2ML IJ SOLN: 4.0000 mg | Freq: Four times a day (QID) | INTRAMUSCULAR | Status: DC | PRN

## 2015-01-23 MED ORDER — MIDAZOLAM HCL 2 MG/2ML IJ SOLN
INTRAMUSCULAR | Status: AC
  Filled 2015-01-23: qty 2

## 2015-01-23 MED ORDER — METOPROLOL TARTRATE 25 MG PO TABS
25.0000 mg | ORAL_TABLET | Freq: Two times a day (BID) | ORAL | Status: DC
  Administered 2015-01-23: 25 mg via ORAL
  Filled 2015-01-23 (×3): qty 1

## 2015-01-23 MED ORDER — ATORVASTATIN CALCIUM 80 MG PO TABS
80.0000 mg | ORAL_TABLET | Freq: Every day | ORAL | Status: DC
  Administered 2015-01-23 – 2015-01-24 (×2): 80 mg via ORAL
  Filled 2015-01-23 (×2): qty 1

## 2015-01-23 MED ORDER — ACETAMINOPHEN 325 MG PO TABS: 650.0000 mg | ORAL_TABLET | ORAL | Status: DC | PRN

## 2015-01-23 MED ORDER — LIDOCAINE HCL (PF) 1 % IJ SOLN
INTRAMUSCULAR | Status: AC
  Filled 2015-01-23: qty 30

## 2015-01-23 MED ORDER — NITROGLYCERIN 1 MG/10 ML FOR IR/CATH LAB
INTRA_ARTERIAL | Status: AC
  Filled 2015-01-23: qty 10

## 2015-01-23 MED ORDER — OXYCODONE-ACETAMINOPHEN 5-325 MG PO TABS: 1.0000 | ORAL_TABLET | ORAL | Status: DC | PRN

## 2015-01-23 MED ORDER — HEPARIN SODIUM (PORCINE) 5000 UNIT/ML IJ SOLN
4000.0000 [IU] | INTRAMUSCULAR | Status: AC
  Administered 2015-01-23: 4000 [IU] via INTRAVENOUS
  Filled 2015-01-23: qty 1

## 2015-01-23 MED ORDER — TICAGRELOR 90 MG PO TABS
90.0000 mg | ORAL_TABLET | Freq: Two times a day (BID) | ORAL | Status: DC
  Administered 2015-01-23 – 2015-01-24 (×2): 90 mg via ORAL
  Filled 2015-01-23 (×3): qty 1

## 2015-01-23 MED ORDER — NITROGLYCERIN 0.4 MG SL SUBL: 0.4000 mg | SUBLINGUAL_TABLET | SUBLINGUAL | Status: DC | PRN

## 2015-01-23 MED ORDER — ASPIRIN EC 81 MG PO TBEC
81.0000 mg | DELAYED_RELEASE_TABLET | Freq: Every day | ORAL | Status: DC
  Administered 2015-01-24: 81 mg via ORAL
  Filled 2015-01-23: qty 1

## 2015-01-23 MED ORDER — FENTANYL CITRATE 0.05 MG/ML IJ SOLN
INTRAMUSCULAR | Status: AC
  Filled 2015-01-23: qty 2

## 2015-01-23 MED ORDER — ASPIRIN 81 MG PO CHEW
324.0000 mg | CHEWABLE_TABLET | Freq: Once | ORAL | Status: AC
  Administered 2015-01-23: 324 mg via ORAL
  Filled 2015-01-23: qty 4

## 2015-01-23 MED ORDER — ASPIRIN 81 MG PO CHEW: 324.0000 mg | CHEWABLE_TABLET | ORAL | Status: DC

## 2015-01-23 MED ORDER — SODIUM CHLORIDE 0.9 % IV SOLN: 250.0000 mL | INTRAVENOUS | Status: DC | PRN

## 2015-01-23 MED ORDER — SODIUM CHLORIDE 0.9 % IV SOLN
INTRAVENOUS | Status: DC
  Administered 2015-01-23: 08:00:00 via INTRAVENOUS

## 2015-01-23 MED ORDER — HEPARIN (PORCINE) IN NACL 2-0.9 UNIT/ML-% IJ SOLN
INTRAMUSCULAR | Status: AC
  Filled 2015-01-23: qty 1000

## 2015-01-23 MED ORDER — SODIUM CHLORIDE 0.9 % IV SOLN: INTRAVENOUS | Status: AC

## 2015-01-23 MED ORDER — BIVALIRUDIN 250 MG IV SOLR
INTRAVENOUS | Status: AC
  Filled 2015-01-23: qty 250

## 2015-01-23 MED ORDER — ASPIRIN 300 MG RE SUPP: 300.0000 mg | RECTAL | Status: DC

## 2015-01-23 NOTE — Progress Notes (Signed)
HR mainly in the 50s/lopressor 25 held Dr Onalee HuaAlvarez made aware.

## 2015-01-23 NOTE — ED Notes (Signed)
Activated code stemi  

## 2015-01-23 NOTE — CV Procedure (Signed)
Cardiac Catheterization Operative Report  Charles HittRichard House [id number] 3/24/20168:54 AM Charles BlamerHARRIS, WILLIAM, MD  Procedure Performed:  1. Left Heart Catheterization 2. Selective Coronary Angiography 3. Left ventricular angiogram 4. SVG angiography 5. LIMA graft angiography 6. PTCA/DES x 2 proximal and mid RCA 7. Angioseal right femoral artery  Operator: Verne Carrowhristopher McAlhany, MD  Indication: 74 yo male with history of CAD s/p 6V CABG 2010 admitted with inferior STEMI.                                      Procedure Details: The risks, benefits, complications, treatment options, and expected outcomes were discussed with the patient. Emergency consent obtained. The patient was further sedated with Versed and Fentanyl. The right groin was prepped and draped in the usual manner. Using the modified Seldinger access technique, a 6 French sheath was placed in the right femoral artery. Standard diagnostic catheters were used to perform selective coronary angiography. I engaged the RCA, all vein grafts and the LIMA graft with the JR4 catheter. The graft to the RCA was down and his RCA was acutely occluded. I elected to proceed to PCI of the RCA.   PCI Note: The patient was given a weight based bolus of Angiomax and a drip was started. He was given Brilinta 180 mg po x 1. When the ACT was over 200 I passed a Cougar IC wire down the RCA. A 2.5 x 15 mm balloon was used to pre-dilate the mid and then proximal vessel and flow was re-established. I then deployed a 2.75 x 24 mm Promus Premier in the mid vessel. A 3.0 x 20 mm Promus Premier DES was deployed in the proximal vessel back to the ostium, overlapping with the mid stent. The mid stent was post-dilated with a 3.0 x 20 mm Gregory balloon x 1. The proximal stented segment was post-dilated with a 3.5 x 12 mm [place] balloon x 1. The stenosis was taken from 100% down to 0%.   A pigtail catheter was used to perform a left ventricular angiogram. There were no  immediate complications. Angioseal right femoral artery. The patient was taken to the recovery area in stable condition.   Hemodynamic Findings: Central aortic pressure: 119/61 Left ventricular pressure: 128/3/20  Angiographic Findings:  Left main: Diffuse 50% stenosis.   Left Anterior Descending Artery: Large caliber vessel that courses to the apex. There is severe stenosis in the mid vessel (80%) with competitive flow seen in the mid and distal vessel from the patent IMA graft.   Circumflex Artery: Large caliber vessel with proximal 50% stenosis. 100% mid occlusion. The three obtuse marginal branches fill from the patent vein graft.   Right Coronary Artery: 100% proximal occlusion, large dominant vessel. The vein graft is no longer patent to the distal vessel.   Graft Anatomy:  SVG sequential to OM1, OM2, OM3 is patent SVG sequential to distal RCA, PDA is occluded LIMA to mid LAD is patent  Left Ventricular Angiogram: LVEF=50-55%.   Impression: 1. Severe triple vessel CAD s/p 6V CABG with 4/6 patent grafts 2. Occluded vein graft to RCA, likely chronic 3. Acute inferior STEMI secondary to acute occlusion proximal RCA 4. Successful PTCA/DES x 2 proximal and mid RCA 5. Preserved LV systolic function  Recommendations: Admit to CCU. Echo in am. ASA, Brilinta, beta blocker. Will start Ace-inh in am if BP stable.  Complications:  None. The patient tolerated the procedure well.

## 2015-01-23 NOTE — Progress Notes (Signed)
Chaplain responded to code STEMI page for pt in A5. [facility] RN [facility] and [facility]'s two sons are en route from out of town. Pt has already gone to cath lab.

## 2015-01-23 NOTE — H&P (Signed)
Patient ID: Charles House MRN: [id number], DOB/AGE: 04/01/1941   Admit date: 01/23/2015   Primary Physician: [name], MD Primary Cardiologist: Dr. [name]  Pt. Profile:  Charles House is a 74 y.o. male with a history of HTN, HLD, OSA, remote PAF, carotid artery disease and CAD s/p CABG x6V (2010) who presented to [facility] today with chest pain and found to have inferior STEMI. He was taken back for emergent cardiac catheterization with possible PCI.   Patient states he has had intermittent chest pain over the past several days but became constant this morning. He describes his pain as a heaviness in the center of his chest associated with some nausea.Marland Kitchen He denies any associated shortness of breath, lightheadedness, dizziness, diaphoresis, or vomiting. He is currently still experiencing 8/10 chest pain. No history of CVA.   Problem List  Past Medical History  Diagnosis Date  . Coronary artery disease     s/p CABG 11/18/08  . Hyperlipidemia   . Hypertension   . Sleep apnea     moderate by prior sleep study, symptoms resolved with weight loss  . Atrial fibrillation     once in setting of a spider bite 1/10, without recurrence  . Carotid artery occlusion     0-39% stenosis by [place] 4/11    Past Surgical History  Procedure Laterality Date  . Tonsillectomy    . Knee arthroscopy    . Coronary artery bypass graft  1/10    by Dr Tyrone Sage     Allergies  No Known Allergies   Home Medications  Prior to Admission medications   Medication Sig Start Date End Date Taking? Authorizing Provider  aspirin 325 MG tablet Take 325 mg by mouth daily.      Historical Provider, MD  atorvastatin (LIPITOR) 20 MG tablet TAKE 1 TABLET BY MOUTH EVERY DAY 08/04/13   [name], MD  Cholecalciferol (VITAMIN D-3 PO) Take 2,000 Units by mouth.     Historical Provider, MD    Family History  No family history on file. Family Status  Relation Status Death Age  . Mother Deceased     Hx  stroke  . Father Deceased     emphysema, vascular disease     Social History  History   Social History  . Marital Status: Married    Spouse Name: N/A  . Number of Children: N/A  . Years of Education: N/A   Occupational History  . Not on file.   Social History Main Topics  . Smoking status: Never Smoker   . Smokeless tobacco: Never Used  . Alcohol Use: No  . Drug Use: No  . Sexual Activity: Not on file   Other Topics Concern  . Not on file   Social History Narrative   Programmer, multimedia,  Retired from [employer], now owns a Arts development officer.     All other systems reviewed and are otherwise negative except as noted above.  Physical Exam  Blood pressure 122/69, pulse 57, temperature 97.9 F (36.6 C), temperature source Oral, resp. rate 14, height  (1.778 m), weight 190 lb (86.183 kg), SpO2 100 %.  General: Pleasant, NAD. Seen in laying in cath lab table. Appears uncomfortabke Psych: Normal affect. Neuro: Alert and oriented X 3. Moves all extremities spontaneously. HEENT: Normal  Neck: Supple without bruits or JVD. Lungs:  Resp regular and unlabored, CTA. Heart: RRR no s3, s4, or murmurs. Abdomen: Soft, non-tender, non-distended, BS + x 4.  Extremities: No  clubbing, cyanosis or edema. DP/PT/Radials 2+ and equal bilaterally.  Labs  No labs   Radiology/Studies  Dg Chest Port 1 View  01/23/2015   CLINICAL DATA:  One day history of chest pain  EXAM: PORTABLE CHEST - 1 VIEW  COMPARISON:  December 12, 2008  FINDINGS: There is no edema or consolidation. The heart size and pulmonary vascularity are within normal limits. Patient is status post coronary artery bypass grafting. No adenopathy. There is a skin fold on the left but no apparent pneumothorax. No bone lesions.  IMPRESSION: No edema or consolidation.   Electronically Signed   By: Bretta BangWilliam  Woodruff III M.D.   On: 01/23/2015 07:43    ECG  HR 65. ST elevation in leads II, III and AVF with reciprocal changes in I  and AVL  ASSESSMENT AND PLAN  Charles House is a 74 y.o. male with a history of HTN, HLD, OSA, remote PAF, carotid artery disease and CAD s/p CABG x6V (2010) who [facility] with chest pain and found to have inferior STEMI. He was taken back for emergent cardiac catheterization with possible PCI.  PLAN -- Taken back for emergent heart cath with possible PCI -- Admit and cycle troponin. BMET. CBC -- Continue ASA, statin. Per notes by Dr. Johney FrameAllred, he has not tolerated a BB in the past.    Signed, Janetta HoraHOMPSON, KATHRYN R, PA-C 01/23/2015, 7:57 AM  I have personally seen and examined this patient with Carlean JewsKatie Thompson, PA-C. I agree with the assessment and plan as outlined above. Inferior STEMI. Emergent cath with possible PCI.   Brody Kump 01/23/2015 9:35 AM

## 2015-01-23 NOTE — Care Management Note (Signed)
    Page 1 of 1   01/23/2015     10:32:25 AM CARE MANAGEMENT NOTE 01/23/2015  Patient:  Charles House,Charles House   Account Number:  [id number]  Date Initiated:  01/23/2015  Documentation initiated by:  Junius CreamerWELL,DEBBIE  Subjective/Objective Assessment:   adm w mi     Action/Plan:   lives w wife, pcp dr Johny Blamerwilliam harris   Anticipated DC Date:     Anticipated DC Plan:  HOME/SELF CARE      DC Planning Services  CM consult  Medication Assistance      Choice offered to / List presented to:             Status of service:   Medicare Important Message given?   (If response is "NO", the following Medicare IM given date fields will be blank) Date Medicare IM given:   Medicare IM given by:   Date Additional Medicare IM given:   Additional Medicare IM given by:    Discharge Disposition:    Per UR Regulation:  Reviewed for med. necessity/level of care/duration of stay  If discussed at Long Length of Stay Meetings, dates discussed:    Comments:  3/24 1031a debbie Stepheni Cameron rn,bsn gave pt 30day free brilinta card. he [place].

## 2015-01-23 NOTE — ED Notes (Signed)
Dr. Romeo AppleHarrison reports Cath Lab MD coming down to see patient.

## 2015-01-23 NOTE — ED Notes (Signed)
Pt reports central cp for several days, got worse this morning while resting. Describes as "heavy" pain. Is constant this morning but has been intermittent. Dr. Johney FrameAllred is cardiologist. Pt is a x 4.

## 2015-01-23 NOTE — ED Notes (Signed)
Cath Lab RN, Marchelle Folksmanda at bedside.

## 2015-01-23 NOTE — ED Provider Notes (Signed)
CSN: [id number]     Arrival date & time 01/23/15  0721 History   First MD Initiated Contact with Patient 01/23/15 [phone]     Chief Complaint  Patient presents with  . Chest Pain     (Consider location/radiation/quality/duration/timing/severity/associated sxs/prior Treatment) The history is provided by the patient and medical records.    This is a 74 year old male with past medical history significant for hypertension, hyperlipidemia, coronary artery disease status post CABG in 2010, presenting to the ED for chest pain. Patient states he has had intermittent chest pain over the past several days but became constant this morning. He describes his pain as a heaviness in the center of his chest. He denies any associated shortness of breath, lightheadedness, dizziness, diaphoresis, nausea, or vomiting.  Patient is followed by cardiology, Dr. [name].  Past Medical History  Diagnosis Date  . Coronary artery disease     s/p CABG 11/18/08  . Hyperlipidemia   . Hypertension   . Sleep apnea     moderate by prior sleep study, symptoms resolved with weight loss  . Atrial fibrillation     once in setting of a spider bite 1/10, without recurrence  . Carotid artery occlusion     0-39% stenosis by [place] 4/11   Past Surgical History  Procedure Laterality Date  . Tonsillectomy    . Knee arthroscopy    . Coronary artery bypass graft  1/10    by Dr Tyrone Sage   No family history on file. History  Substance Use Topics  . Smoking status: Never Smoker   . Smokeless tobacco: Never Used  . Alcohol Use: No    Review of Systems  Cardiovascular: Positive for chest pain.  All other systems reviewed and are negative.     Allergies  Review of patient's allergies indicates no known allergies.  Home Medications   Prior to Admission medications   Medication Sig Start Date End Date Taking? Authorizing Provider  aspirin 325 MG tablet Take 325 mg by mouth daily.      Historical Provider, MD  atorvastatin  (LIPITOR) 20 MG tablet TAKE 1 TABLET BY MOUTH EVERY DAY 08/04/13   [name], MD  Cholecalciferol (VITAMIN D-3 PO) Take 2,000 Units by mouth.     Historical Provider, MD   BP 123/63 mmHg  Pulse 62  Temp(Src) 97.9 F (36.6 C) (Oral)  Resp 14  Ht  (1.778 m)  Wt 190 lb (86.183 kg)  BMI 27.26 kg/m2  SpO2 99%   Physical Exam  Constitutional: He is oriented to person, place, and time. He appears well-developed and well-nourished.  HENT:  Head: Normocephalic and atraumatic.  Mouth/Throat: Oropharynx is clear and moist.  Eyes: Conjunctivae and EOM are normal. Pupils are equal, round, and reactive to light.  Neck: Normal range of motion. Neck supple.  Cardiovascular: Normal rate, regular rhythm and normal heart sounds.   Pulmonary/Chest: Effort normal and breath sounds normal. No respiratory distress. He has no wheezes.  Abdominal: Soft. Bowel sounds are normal. There is no tenderness. There is no guarding.  Musculoskeletal: Normal range of motion. He exhibits no edema.  Neurological: He is alert and oriented to person, place, and time.  Skin: Skin is warm and dry. He is not diaphoretic.  Psychiatric: He has a normal mood and affect.  Nursing note and vitals reviewed.   ED Course  Procedures (including critical care time)  CRITICAL CARE Performed by: Garlon Hatchet   Total critical care time: 45  Critical care  time was exclusive of separately billable procedures and treating other patients.  Critical care was necessary to treat or prevent imminent or life-threatening deterioration.  Critical care was time spent personally by me on the following activities: development of treatment plan with patient and/or surrogate as well as nursing, discussions with consultants, evaluation of patient's response to treatment, examination of patient, obtaining history from patient or surrogate, ordering and performing treatments and interventions, ordering and review of laboratory studies,  ordering and review of radiographic studies, pulse oximetry and re-evaluation of patient's condition.  Medications  0.9 %  sodium chloride infusion ( Intravenous New Bag/Given 01/23/15 0741)  aspirin chewable tablet 324 mg (324 mg Oral Given 01/23/15 0732)  heparin injection 4,000 Units (4,000 Units Intravenous Given 01/23/15 0734)  fentaNYL (SUBLIMAZE) injection 70 mcg (70 mcg Intravenous Given 01/23/15 0741)    Labs Review Labs Reviewed  POCT I-STAT TROPONIN I - Abnormal; Notable for the following:    Troponin i, poc 0.13 (*)    All other components within normal limits  APTT  CBC  COMPREHENSIVE METABOLIC PANEL  PROTIME-INR  Rosezena SensorI-STAT TROPOININ, ED    Imaging Review Dg Chest Port 1 View  01/23/2015   CLINICAL DATA:  One day history of chest pain  EXAM: PORTABLE CHEST - 1 VIEW  COMPARISON:  December 12, 2008  FINDINGS: There is no edema or consolidation. The heart size and pulmonary vascularity are within normal limits. Patient is status post coronary artery bypass grafting. No adenopathy. There is a skin fold on the left but no apparent pneumothorax. No bone lesions.  IMPRESSION: No edema or consolidation.   Electronically Signed   By: Bretta BangWilliam  Woodruff III M.D.   On: 01/23/2015 07:43     EKG Interpretation   Date/Time:  Thursday January 23 2015 38:10:1707:26:25 EDT Ventricular Rate:  65 PR Interval:  138 QRS Duration: 89 QT Interval:  396 QTC Calculation: 412 R Axis:   73 Text Interpretation:  Sinus rhythm Inferior infarct, acute (RCA) Probable  RV involvement, suggest recording right precordial leads Baseline wander  in lead(s) V2 Inferior stemi Confirmed by HARRISON  MD, FORREST ([id number]) on  01/23/2015 7:41:16 AM      MDM   Final diagnoses:  ST elevation myocardial infarction involving right coronary artery   74 y.o. M with intermittent chest pain which became constant this morning.  EKG with inferior infarct, reciprocal changes noted-- CODE STEMI activated, patient immediately given  ASA and heparin bolus.  Case discussed with Dr. Clifton JamesMcAlhany with cardiology, patient emergently taken to cath lab.  I have called and left messages with family.   Garlon HatchetLisa M Carlester Kasparek, PA-C 01/23/15 [phone]  Purvis SheffieldForrest Harrison, MD 01/24/15 [phone]

## 2015-01-24 ENCOUNTER — Encounter (HOSPITAL_COMMUNITY): Payer: Self-pay | Admitting: Cardiovascular Disease

## 2015-01-24 DIAGNOSIS — E785 Hyperlipidemia, unspecified: Secondary | ICD-10-CM

## 2015-01-24 DIAGNOSIS — I2111 ST elevation (STEMI) myocardial infarction involving right coronary artery: Secondary | ICD-10-CM

## 2015-01-24 DIAGNOSIS — I2119 ST elevation (STEMI) myocardial infarction involving other coronary artery of inferior wall: Secondary | ICD-10-CM

## 2015-01-24 DIAGNOSIS — R001 Bradycardia, unspecified: Secondary | ICD-10-CM | POA: Diagnosis present

## 2015-01-24 DIAGNOSIS — I10 Essential (primary) hypertension: Secondary | ICD-10-CM

## 2015-01-24 DIAGNOSIS — Z951 Presence of aortocoronary bypass graft: Secondary | ICD-10-CM

## 2015-01-24 DIAGNOSIS — R739 Hyperglycemia, unspecified: Secondary | ICD-10-CM | POA: Diagnosis present

## 2015-01-24 DIAGNOSIS — I2510 Atherosclerotic heart disease of native coronary artery without angina pectoris: Secondary | ICD-10-CM | POA: Diagnosis present

## 2015-01-24 LAB — PROTIME-INR
INR: 1.04 (ref 0.00–1.49)
Prothrombin Time: 13.7 seconds (ref 11.6–15.2)

## 2015-01-24 LAB — CBC
HCT: 42.1 % (ref 39.0–52.0)
Hemoglobin: 14.3 g/dL (ref 13.0–17.0)
MCH: 30 pg (ref 26.0–34.0)
MCHC: 34 g/dL (ref 30.0–36.0)
MCV: 88.4 fL (ref 78.0–100.0)
Platelets: 154 10*3/uL (ref 150–400)
RBC: 4.76 MIL/uL (ref 4.22–5.81)
RDW: 12.9 % (ref 11.5–15.5)
WBC: 9.2 10*3/uL (ref 4.0–10.5)

## 2015-01-24 LAB — COMPREHENSIVE METABOLIC PANEL
ALBUMIN: 3.4 g/dL — AB (ref 3.5–5.2)
ALT: 27 U/L (ref 0–53)
ANION GAP: 8 (ref 5–15)
AST: 66 U/L — ABNORMAL HIGH (ref 0–37)
Alkaline Phosphatase: 75 U/L (ref 39–117)
BUN: 12 mg/dL (ref 6–23)
CHLORIDE: 106 mmol/L (ref 96–112)
CO2: 25 mmol/L (ref 19–32)
Calcium: 9.1 mg/dL (ref 8.4–10.5)
Creatinine, Ser: 0.95 mg/dL (ref 0.50–1.35)
GFR calc non Af Amer: 82 mL/min — ABNORMAL LOW (ref >90)
Glucose, Bld: 112 mg/dL — ABNORMAL HIGH (ref 70–99)
POTASSIUM: 3.9 mmol/L (ref 3.5–5.1)
Sodium: 139 mmol/L (ref 135–145)
TOTAL PROTEIN: 6.3 g/dL (ref 6.0–8.3)
Total Bilirubin: 1.3 mg/dL — ABNORMAL HIGH (ref 0.3–1.2)

## 2015-01-24 LAB — TROPONIN I: TROPONIN I: 13.4 ng/mL — AB (ref <0.031)

## 2015-01-24 LAB — LIPID PANEL
CHOL/HDL RATIO: 3.6 ratio
Cholesterol: 153 mg/dL (ref 0–200)
HDL: 43 mg/dL (ref >39)
LDL Cholesterol: 89 mg/dL (ref 0–99)
TRIGLYCERIDES: 106 mg/dL (ref <150)
VLDL: 21 mg/dL (ref 0–40)

## 2015-01-24 LAB — HEMOGLOBIN A1C
Hgb A1c MFr Bld: 5.9 % — ABNORMAL HIGH (ref 4.8–5.6)
MEAN PLASMA GLUCOSE: 123 mg/dL

## 2015-01-24 MED ORDER — ASPIRIN 81 MG PO TABS: 81.0000 mg | ORAL_TABLET | Freq: Every day | ORAL | Status: DC

## 2015-01-24 MED ORDER — NITROGLYCERIN 0.4 MG SL SUBL: 0.4000 mg | SUBLINGUAL_TABLET | SUBLINGUAL | Status: DC | PRN

## 2015-01-24 MED ORDER — TICAGRELOR 90 MG PO TABS: 90.0000 mg | ORAL_TABLET | Freq: Two times a day (BID) | ORAL | Status: DC

## 2015-01-24 MED ORDER — ATORVASTATIN CALCIUM 80 MG PO TABS: 80.0000 mg | ORAL_TABLET | Freq: Every evening | ORAL | Status: DC

## 2015-01-24 MED FILL — Sodium Chloride IV Soln 0.9%: INTRAVENOUS | Qty: 50 | Status: AC

## 2015-01-24 NOTE — Progress Notes (Signed)
  Echocardiogram 2D Echocardiogram has been performed.  Leta JunglingCooper, Blandina Renaldo M 01/24/2015, 1:42 PM

## 2015-01-24 NOTE — Progress Notes (Signed)
DAILY PROGRESS NOTE  Subjective:  No events overnight. Chest pain has resolved. Bradycardic overnight-  Has been intolerant of b-blockers in the past - concern given inferior STEMI presentation with bradycardia. Troponin rose to 23.66 and is now coming down.  TC 153, TG 106, HDL 43, LDL 89.  Now on atorvastatin 80 mg daily.  Objective:  Temp:  [98 F (36.7 C)-98.8 F (37.1 C)] 98.5 F (36.9 C) (03/25 0400) Pulse Rate:  [48-74] 53 (03/25 0800) Resp:  [10-22] 13 (03/25 0800) BP: (90-157)/(36-125) 119/69 mmHg (03/25 0900) SpO2:  [93 %-99 %] 96 % (03/25 0800) Weight:  [198 lb 13.7 oz (90.2 kg)] 198 lb 13.7 oz (90.2 kg) (03/25 0500) Weight change:   Intake/Output from previous day: 03/24 0701 - 03/25 0700 In: 1520 [P.O.:1070; I.V.:450] Out: 2200 [Urine:2200]  Intake/Output from this shift: Total I/O In: 240 [P.O.:240] Out: 125 [Urine:125]  Medications: Current Facility-Administered Medications  Medication Dose Route Frequency Provider Last Rate Last Dose  . 0.9 %  sodium chloride infusion   Intravenous Continuous Larene Pickett, PA-C 20 mL/hr at 01/23/15 0741    . 0.9 %  sodium chloride infusion  250 mL Intravenous PRN Eileen Stanford, PA-C      . acetaminophen (TYLENOL) tablet 650 mg  650 mg Oral Q4H PRN Burnell Blanks, MD      . aspirin EC tablet 81 mg  81 mg Oral Daily Eileen Stanford, PA-C      . atorvastatin (LIPITOR) tablet 80 mg  80 mg Oral q1800 Eileen Stanford, PA-C   80 mg at 01/23/15 1809  . morphine 2 MG/ML injection 2 mg  2 mg Intravenous Q1H PRN Burnell Blanks, MD      . nitroGLYCERIN (NITROSTAT) SL tablet 0.4 mg  0.4 mg Sublingual Q5 Min x 3 PRN Eileen Stanford, PA-C      . ondansetron [facility]) injection 4 mg  4 mg Intravenous Q6H PRN Burnell Blanks, MD      . oxyCODONE-acetaminophen (PERCOCET/ROXICET) 5-325 MG per tablet 1-2 tablet  1-2 tablet Oral Q4H PRN Burnell Blanks, MD      . sodium chloride 0.9 % injection 3  mL  3 mL Intravenous Q12H Eileen Stanford, PA-C   3 mL at 01/23/15 2200  . sodium chloride 0.9 % injection 3 mL  3 mL Intravenous PRN Eileen Stanford, PA-C      . ticagrelor [facility]) tablet 90 mg  90 mg Oral BID Burnell Blanks, MD   90 mg at 01/23/15 2158    Physical Exam: General appearance: alert and no distress Neck: no carotid bruit and no JVD Lungs: clear to auscultation bilaterally Heart: regular rate and rhythm, S1, S2 normal, no murmur, click, rub or gallop Abdomen: soft, non-tender; bowel sounds normal; no masses,  no organomegaly Extremities: extremities normal, atraumatic, no cyanosis or edema Pulses: 2+ and symmetric Skin: Skin color, texture, turgor normal. No rashes or lesions Neurologic: Grossly normal Psych: normal  Lab Results: Results for orders placed or performed during the hospital encounter of 01/23/15 (from the past 48 hour(s))  APTT     Status: Abnormal   Collection Time: 01/23/15  7:35 AM  Result Value Ref Range   aPTT 23 (L) 24 - 37 seconds  CBC     Status: None   Collection Time: 01/23/15  7:35 AM  Result Value Ref Range   WBC 9.7 4.0 - 10.5 K/uL   RBC 5.00 4.22 -  5.81 MIL/uL   Hemoglobin 15.2 13.0 - 17.0 g/dL   HCT 45.0 39.0 - 52.0 %   MCV 90.0 78.0 - 100.0 fL   MCH 30.4 26.0 - 34.0 pg   MCHC 33.8 30.0 - 36.0 g/dL   RDW 13.0 11.5 - 15.5 %   Platelets 181 150 - 400 K/uL  Comprehensive metabolic panel     Status: Abnormal   Collection Time: 01/23/15  7:35 AM  Result Value Ref Range   Sodium 139 135 - 145 mmol/L   Potassium 3.9 3.5 - 5.1 mmol/L   Chloride 105 96 - 112 mmol/L   CO2 22 19 - 32 mmol/L   Glucose, Bld 176 (H) 70 - 99 mg/dL   BUN 21 6 - 23 mg/dL   Creatinine, Ser 0.96 0.50 - 1.35 mg/dL   Calcium 9.1 8.4 - 10.5 mg/dL   Total Protein 6.7 6.0 - 8.3 g/dL   Albumin 3.8 3.5 - 5.2 g/dL   AST 24 0 - 37 U/L   ALT 22 0 - 53 U/L   Alkaline Phosphatase 75 39 - 117 U/L   Total Bilirubin 0.9 0.3 - 1.2 mg/dL   GFR calc non Af  Amer 81 (L) >90 mL/min   GFR calc Af Amer >90 >90 mL/min    Comment: (NOTE) The eGFR has been calculated using the CKD EPI equation. This calculation has not been validated in all clinical situations. eGFR's persistently <90 mL/min signify possible Chronic Kidney Disease.    Anion gap 12 5 - 15  Protime-INR     Status: None   Collection Time: 01/23/15  7:35 AM  Result Value Ref Range   Prothrombin Time 13.5 11.6 - 15.2 seconds   INR 1.02 0.00 - 1.49  POCT i-Stat troponin I     Status: Abnormal   Collection Time: 01/23/15  7:41 AM  Result Value Ref Range   Troponin i, poc 0.13 (HH) 0.00 - 0.08 ng/mL   Comment NOTIFIED PHYSICIAN    Comment 3            Comment: Due to the release kinetics of cTnI, a negative result within the first hours of the onset of symptoms does not rule out myocardial infarction with certainty. If myocardial infarction is still suspected, repeat the test at appropriate intervals.   POCT Activated clotting time     Status: None   Collection Time: 01/23/15  8:07 AM  Result Value Ref Range   Activated Clotting Time 435 seconds  I-STAT, chem 8     Status: Abnormal   Collection Time: 01/23/15  8:40 AM  Result Value Ref Range   Sodium 107 (LL) 135 - 145 mmol/L   Potassium 2.8 (L) 3.5 - 5.1 mmol/L   Chloride 71 (L) 96 - 112 mmol/L   BUN 18 6 - 23 mg/dL   Creatinine, Ser 0.30 (L) 0.50 - 1.35 mg/dL   Glucose, Bld 88 70 - 99 mg/dL   Calcium, Ion 1.04 (L) 1.13 - 1.30 mmol/L   TCO2 14 0 - 100 mmol/L   Hemoglobin 14.6 13.0 - 17.0 g/dL   HCT 43.0 39.0 - 52.0 %  MRSA PCR Screening     Status: None   Collection Time: 01/23/15  9:31 AM  Result Value Ref Range   MRSA by PCR NEGATIVE NEGATIVE    Comment:        The GeneXpert MRSA Assay (FDA approved for NASAL specimens only), is one component of a comprehensive MRSA colonization surveillance program.  It is not intended to diagnose MRSA infection nor to guide or monitor treatment for MRSA infections.     Protime-INR     Status: Abnormal   Collection Time: 01/23/15 10:34 AM  Result Value Ref Range   Prothrombin Time 15.7 (H) 11.6 - 15.2 seconds   INR 1.23 0.00 - 1.49  Troponin I-(serum)     Status: Abnormal   Collection Time: 01/23/15 10:34 AM  Result Value Ref Range   Troponin I 4.34 (HH) <0.031 ng/mL    Comment:        POSSIBLE MYOCARDIAL ISCHEMIA. SERIAL TESTING RECOMMENDED. REPEATED TO VERIFY CRITICAL RESULT CALLED TO, READ BACK BY AND VERIFIED WITH: T.OLEARY,RN 1215 01/23/15 CLARK,S   Hemoglobin A1c     Status: Abnormal   Collection Time: 01/23/15 10:34 AM  Result Value Ref Range   Hgb A1c MFr Bld 5.9 (H) 4.8 - 5.6 %    Comment: (NOTE)         Pre-diabetes: 5.7 - 6.4         Diabetes: >6.4         Glycemic control for adults with diabetes: <7.0    Mean Plasma Glucose 123 mg/dL    Comment: (NOTE) Performed At: [facility] [place], [place] [id number] Lindon Romp MD NW:[id number]   Brain natriuretic peptide     Status: None   Collection Time: 01/23/15 10:34 AM  Result Value Ref Range   B Natriuretic Peptide 75.4 0.0 - 100.0 pg/mL  TSH     Status: None   Collection Time: 01/23/15 10:34 AM  Result Value Ref Range   TSH 2.152 0.350 - 4.500 uIU/mL  Troponin I-(serum)     Status: Abnormal   Collection Time: 01/23/15  3:09 PM  Result Value Ref Range   Troponin I 19.60 (HH) <0.031 ng/mL    Comment:        POSSIBLE MYOCARDIAL ISCHEMIA. SERIAL TESTING RECOMMENDED. REPEATED TO VERIFY CRITICAL VALUE NOTED.  VALUE IS CONSISTENT WITH PREVIOUSLY REPORTED AND CALLED VALUE.   Troponin I-(serum)     Status: Abnormal   Collection Time: 01/23/15  9:25 PM  Result Value Ref Range   Troponin I 23.66 (HH) <0.031 ng/mL    Comment:        POSSIBLE MYOCARDIAL ISCHEMIA. SERIAL TESTING RECOMMENDED. CRITICAL VALUE NOTED.  VALUE IS CONSISTENT WITH PREVIOUSLY REPORTED AND CALLED VALUE. REPEATED TO VERIFY   Comprehensive metabolic panel     Status:  Abnormal   Collection Time: 01/24/15  3:05 AM  Result Value Ref Range   Sodium 139 135 - 145 mmol/L    Comment: DELTA CHECK NOTED   Potassium 3.9 3.5 - 5.1 mmol/L    Comment: DELTA CHECK NOTED   Chloride 106 96 - 112 mmol/L    Comment: DELTA CHECK NOTED   CO2 25 19 - 32 mmol/L   Glucose, Bld 112 (H) 70 - 99 mg/dL   BUN 12 6 - 23 mg/dL   Creatinine, Ser 0.95 0.50 - 1.35 mg/dL    Comment: DELTA CHECK NOTED   Calcium 9.1 8.4 - 10.5 mg/dL   Total Protein 6.3 6.0 - 8.3 g/dL   Albumin 3.4 (L) 3.5 - 5.2 g/dL   AST 66 (H) 0 - 37 U/L   ALT 27 0 - 53 U/L   Alkaline Phosphatase 75 39 - 117 U/L   Total Bilirubin 1.3 (H) 0.3 - 1.2 mg/dL   GFR calc non Af Amer 82 (L) >90 mL/min  GFR calc Af Amer >90 >90 mL/min    Comment: (NOTE) The eGFR has been calculated using the CKD EPI equation. This calculation has not been validated in all clinical situations. eGFR's persistently <90 mL/min signify possible Chronic Kidney Disease.    Anion gap 8 5 - 15  CBC     Status: None   Collection Time: 01/24/15  3:05 AM  Result Value Ref Range   WBC 9.2 4.0 - 10.5 K/uL   RBC 4.76 4.22 - 5.81 MIL/uL   Hemoglobin 14.3 13.0 - 17.0 g/dL   HCT 42.1 39.0 - 52.0 %   MCV 88.4 78.0 - 100.0 fL   MCH 30.0 26.0 - 34.0 pg   MCHC 34.0 30.0 - 36.0 g/dL   RDW 12.9 11.5 - 15.5 %   Platelets 154 150 - 400 K/uL  Protime-INR     Status: None   Collection Time: 01/24/15  3:05 AM  Result Value Ref Range   Prothrombin Time 13.7 11.6 - 15.2 seconds   INR 1.04 0.00 - 1.49  Troponin I     Status: Abnormal   Collection Time: 01/24/15  5:00 AM  Result Value Ref Range   Troponin I 13.40 (HH) <0.031 ng/mL    Comment:        POSSIBLE MYOCARDIAL ISCHEMIA. SERIAL TESTING RECOMMENDED. REPEATED TO VERIFY CRITICAL VALUE NOTED.  VALUE IS CONSISTENT WITH PREVIOUSLY REPORTED AND CALLED VALUE.   Lipid panel     Status: None   Collection Time: 01/24/15  7:00 AM  Result Value Ref Range   Cholesterol 153 0 - 200 mg/dL    Triglycerides 106 <150 mg/dL   HDL 43 >39 mg/dL   Total CHOL/HDL Ratio 3.6 RATIO   VLDL 21 0 - 40 mg/dL   LDL Cholesterol 89 0 - 99 mg/dL    Comment:        Total Cholesterol/HDL:CHD Risk Coronary Heart Disease Risk Table                     Men   Women  1/2 Average Risk   3.4   3.3  Average Risk       5.0   4.4  2 X Average Risk   9.6   7.1  3 X Average Risk  23.4   11.0        Use the calculated Patient Ratio above and the CHD Risk Table to determine the patient's CHD Risk.        ATP III CLASSIFICATION (LDL):  <100     mg/dL   Optimal  100-129  mg/dL   Near or Above                    Optimal  130-159  mg/dL   Borderline  160-189  mg/dL   High  >190     mg/dL   Very High     Imaging: Dg Chest Port 1 View  01/23/2015   CLINICAL DATA:  One day history of chest pain  EXAM: PORTABLE CHEST - 1 VIEW  COMPARISON:  December 12, 2008  FINDINGS: There is no edema or consolidation. The heart size and pulmonary vascularity are within normal limits. Patient is status post coronary artery bypass grafting. No adenopathy. There is a skin fold on the left but no apparent pneumothorax. No bone lesions.  IMPRESSION: No edema or consolidation.   Electronically Signed   By: Lowella Grip III M.D.   On: 01/23/2015 07:43  Assessment:  Principal Problem:   ST elevation myocardial infarction (STEMI) of inferior wall Active Problems:   Hyperlipidemia   Essential hypertension   Coronary atherosclerosis   STEMI (ST elevation myocardial infarction)   S/P CABG x 6   Plan: 1. Inferior STEMI - marked symptomatic improvement. Troponin is trending down. No further symptoms throughout the day yesterday. On ASA and Brillinta. Will check 2D echo this am. Ambulate with cardiac rehab. Suspect he can fast track and be discharged later today, provided LV function is stable. 2. HTN - appears controlled. b-blocker discontinued due to bradycardia. Consider ACE-I on discharge - BP is low normal at this  time and may not allow it. 3. Dyslipidemia - now on atorvastatin 80 mg (was on 20 mg). LDL goal <70. May benefit from particle testing as an outpatient.  Doing very well - no further symptoms. HR and bp stable. Ambulate today. Pending echo findings, could discharge later today. Follow-up with Dr. Rayann Heman as an outpatient.  Time Spent Directly with Patient:  30 minutes  Length of Stay:  LOS: 1 day   Pixie Casino, MD, [facility] Attending Cardiologist [facility]  Nithya Meriweather C 01/24/2015, 9:23 AM   ]

## 2015-01-24 NOTE — Progress Notes (Signed)
CARDIAC REHAB PHASE I   Pt walked with RN 4 laps without problem. Ed completed with pt and wife. Good reception. Not interested in CRPII as he likes to ex on his own. Understands Brilinta and NTG. [id number]  Elissa LovettReeve, Etan Vasudevan CambridgeKristan CES, ACSM 01/24/2015 2:21 PM

## 2015-01-24 NOTE — Discharge Summary (Signed)
Discharge Summary   Patient ID: Charles House MRN: [id number], DOB/AGE: 1941/07/24 74 y.o. Admit date: 01/23/2015 D/C date:     01/24/2015  Primary Care Provider: [name], MD Primary Cardiologist: Dr. [name]  Primary Discharge Diagnoses:  1. Inferior STEMI/CAD - prior history of CABGx6V (2010) - acute inferior STEMI secondary to acute occlusion proximal RCA s/p successful PTCA/DES x 2 proximal and mid RCA, EF 50-55% 2. HTN 3. Sinus bradycardia 4. Dyslipidemia 5. Hyperglycemia A1C 5.9  Secondary Discharge Diagnoses:  1. Remote PAF, once in setting of a spider bite 1/10, without recurrence 2. Carotid artery disease, 0-39% stenosis by [place] 4/11 3. Sleep apnea, moderate by prior sleep study, symptoms resolved with weight loss  Hospital Course: Charles House is a 74 y/o M with history CAD s/p CABGx6 in 2010, HTN, HLD, OSA, remote PAF, carotid artery disease who presented to [facility] on 01/23/15 with chest pain and found to have inferior STEMI. He has had intermittent chest pain over the past several days but it became constant yesterday AM. He had associated nausea. He denies any associated shortness of breath, lightheadedness, dizziness, diaphoresis, or vomiting. He was taken back for emergent cardiac catheterization with possible PCI. He was still experiencing 8/10 chest pain upon arrival to the cath lab. Cardiac cath demonstrated: Impression: 1. Severe triple vessel CAD s/p 6V CABG with 4/6 patent grafts 2. Occluded vein graft to RCA, likely chronic 3. Acute inferior STEMI secondary to acute occlusion proximal RCA 4. Successful PTCA/DES x 2 proximal and mid RCA 5. Preserved LV systolic function EF 50-55% He was placed on Brilinta with plan to continue DAPT. Beta blocker was started but then stopped due to bradycardia and concern given inferior STEMI presentation with bradycardia. Of note he has been intolerant of BB in the past. Troponins 4.3->19.6->23.6->13.4. Atorvastatin was titrated to   daily. LDL 89. 2D Echo showed EF 40-98%, mild LVH, trivial AI. Labs otherwise showed glucose 112, normal CBC, A1C 5.9, normal TSH. The patient did very well post-STEMI and was felt eligible for discharge this afternoon on fast track. ACEI was considered but not started due to soft BPs, most recently 108/65 - would consider initiation in follow-up. He ambulated well with cardiac rehab. Dr. Rennis Golden has seen and examined the patient today and feels he is stable for discharge. I have sent a message to our [facility] office's scheduler requesting a follow-up appointment, and our office will call the patient with this information. Will get 1 week TOC appt in flex clinic. Consider f/u LFTs/lipids 6 weeks.  Discharge Vitals: Blood pressure 108/65, pulse 58, temperature 98.2 F (36.8 C), temperature source Oral, resp. rate 18, height  (1.778 m), weight 198 lb 13.7 oz (90.2 kg), SpO2 96 %.  Labs: Lab Results  Component Value Date   WBC 9.2 01/24/2015   HGB 14.3 01/24/2015   HCT 42.1 01/24/2015   MCV 88.4 01/24/2015   PLT 154 01/24/2015    Recent Labs Lab 01/24/15 0305  NA 139  K 3.9  CL 106  CO2 25  BUN 12  CREATININE 0.95  CALCIUM 9.1  PROT 6.3  BILITOT 1.3*  ALKPHOS 75  ALT 27  AST 66*  GLUCOSE 112*    Recent Labs  01/23/15 1034 01/23/15 1509 01/23/15 2125 01/24/15 0500  TROPONINI 4.34* 19.60* 23.66* 13.40*   Lab Results  Component Value Date   CHOL 153 01/24/2015   HDL 43 01/24/2015   LDLCALC 89 01/24/2015   TRIG 106 01/24/2015  Diagnostic Studies/Procedures   Dg Chest Port 1 View  01/23/2015   CLINICAL DATA:  One day history of chest pain  EXAM: PORTABLE CHEST - 1 VIEW  COMPARISON:  December 12, 2008  FINDINGS: There is no edema or consolidation. The heart size and pulmonary vascularity are within normal limits. Patient is status post coronary artery bypass grafting. No adenopathy. There is a skin fold on the left but no apparent pneumothorax. No bone lesions.   IMPRESSION: No edema or consolidation.   Electronically Signed   By: Bretta BangWilliam  Woodruff III M.D.   On: 01/23/2015 07:43   Cardiac catheterization this admission, please see full report and above for summary.  2D Echo 01/24/15 - Left ventricle: The cavity size was normal. Wall thickness was increased in a pattern of mild LVH. Systolic function was normal. The estimated ejection fraction was in the range of 55% to 60%. - Aortic valve: There was trivial regurgitation.   Discharge Medications   Current Discharge Medication List    START taking these medications   Details  nitroGLYCERIN (NITROSTAT) 0.4 MG SL tablet Place 1 tablet (0.4 mg total) under the tongue every 5 (five) minutes as needed for chest pain (up to 3 doses). Qty: 25 tablet, Refills: 3    ticagrelor (BRILINTA) 90 MG TABS tablet Take 1 tablet (90 mg total) by mouth 2 (two) times daily. Qty: 60 tablet, Refills: 11      CONTINUE these medications which have CHANGED   Details  aspirin 81 MG tablet Take 1 tablet (81 mg total) by mouth daily. Qty: 30 tablet, Refills: 11    atorvastatin (LIPITOR) 80 MG tablet Take 1 tablet (80 mg total) by mouth every evening. Qty: 30 tablet, Refills: 6      CONTINUE these medications which have NOT CHANGED   Details  Cholecalciferol (VITAMIN D-3 PO) Take 2,000 Units by mouth.         Disposition   The patient will be discharged in stable condition to home. Discharge Instructions    Diet - low sodium heart healthy    Complete by:  As directed      Increase activity slowly    Complete by:  As directed   No driving for 2 weeks. No lifting over 10 lbs for 4 weeks. No sexual activity for 4 weeks. You may not return to work until cleared by your cardiologist. Keep procedure site clean & dry. If you notice increased pain, swelling, bleeding or pus, call/return!  You may shower, but no soaking baths/hot tubs/pools for 1 week.          Follow-up Information    Follow up with Hillis RangeJames  Allred, MD.   Specialty:  Cardiology   Why:  Office will call you for your follow-up. Call the office if you do not hear back within 3 days.   Contact information:   [address] [place] [address] [phone]           Duration of Discharge Encounter: Greater than 30 minutes including physician and PA time.  Signed, Ronie Spiesayna Dunn PA-C 01/24/2015, 5:45 PM

## 2015-01-27 ENCOUNTER — Telehealth: Payer: Self-pay

## 2015-01-27 ENCOUNTER — Telehealth: Payer: Self-pay | Admitting: Internal Medicine

## 2015-01-27 NOTE — Telephone Encounter (Signed)
7 day TOC eph fu appt-appt with Lori 02-03-15 qt 10a.

## 2015-01-27 NOTE — Telephone Encounter (Signed)
TCM: 7 day TOC eph fu appt-appt with Lori 02-03-15 qt 10a.  Patient contacted regarding discharge from hospital on January 24, 2015.  Patient understands to follow up with Charles FredricksonLori Gerhardt, NP, on Monday, April 4th at 10:00 am.  Patient understands discharge instructions for diet, activity, return appointment and medications.  Patient has all medications on hand and understands dose/frequency regiment.  Patient understands to bring all medications to his appointment next week. He denies any questions or concerns at this time.

## 2015-02-03 ENCOUNTER — Encounter: Payer: Self-pay | Admitting: Nurse Practitioner

## 2015-02-03 ENCOUNTER — Ambulatory Visit (INDEPENDENT_AMBULATORY_CARE_PROVIDER_SITE_OTHER): Payer: Medicare HMO | Admitting: Nurse Practitioner

## 2015-02-03 VITALS — BP 122/78 | HR 65 | Resp 18 | Ht 70.0 in | Wt 200.0 lb

## 2015-02-03 DIAGNOSIS — Z955 Presence of coronary angioplasty implant and graft: Secondary | ICD-10-CM | POA: Diagnosis not present

## 2015-02-03 DIAGNOSIS — I2510 Atherosclerotic heart disease of native coronary artery without angina pectoris: Secondary | ICD-10-CM

## 2015-02-03 LAB — CBC
HCT: 42.6 % (ref 39.0–52.0)
Hemoglobin: 14.5 g/dL (ref 13.0–17.0)
MCHC: 33.9 g/dL (ref 30.0–36.0)
MCV: 88.5 fl (ref 78.0–100.0)
Platelets: 201 10*3/uL (ref 150.0–400.0)
RBC: 4.82 Mil/uL (ref 4.22–5.81)
RDW: 13.3 % (ref 11.5–15.5)
WBC: 5.9 10*3/uL (ref 4.0–10.5)

## 2015-02-03 LAB — BASIC METABOLIC PANEL WITH GFR
BUN: 16 mg/dL (ref 6–23)
CO2: 26 meq/L (ref 19–32)
Calcium: 9 mg/dL (ref 8.4–10.5)
Chloride: 105 meq/L (ref 96–112)
Creatinine, Ser: 1 mg/dL (ref 0.40–1.50)
GFR: 78.15 mL/min
Glucose, Bld: 107 mg/dL — ABNORMAL HIGH (ref 70–99)
Potassium: 4 meq/L (ref 3.5–5.1)
Sodium: 137 meq/L (ref 135–145)

## 2015-02-03 MED ORDER — NITROGLYCERIN 0.4 MG SL SUBL: 0.4000 mg | SUBLINGUAL_TABLET | SUBLINGUAL | Status: DC | PRN

## 2015-02-03 NOTE — Patient Instructions (Addendum)
We will be checking the following labs today BMET & CBC  Stay on your current medicines  I refilled the NTG - ok to put a bottle in the fridge - this will last up to 2 years  See Dr. Johney FrameAllred with fasting labs in 4 to 6 weeks  Call the [facility] office at [phone] if you have any questions, problems or concerns.

## 2015-02-03 NOTE — Progress Notes (Signed)
CARDIOLOGY OFFICE NOTE  Date:  02/03/2015    Charles House Date of Birth: [birth year] Medical Record #[id number]  PCP:  Johny BlamerHARRIS, WILLIAM, MD  Cardiologist:  Allred    Chief Complaint  Patient presents with  . Coronary Artery Disease    TOC - post hospital visit - seen for Dr. Johney FrameAllred.     History of Present Illness: Charles House is a 74 y.o. male who presents today for a post hospital/TOC visit. Seen for Dr. Johney FrameAllred. He has a history of CAD s/p CABG x 6 in 2010 per [facility], HTN, HLD, OSA, remote PAF, and carotid artery disease.  [facility] to [facility]15 with chest pain and found to have inferior STEMI.  He was taken for emergent cardiac catheterization with possible PCI. He was still experiencing 8/10 chest pain upon arrival to the cath lab. Cardiac cath demonstrated: Impression: 1. Severe triple vessel CAD s/p 6V CABG with 4/6 patent grafts 2. Occluded vein graft to RCA, likely chronic 3. Acute inferior STEMI secondary to acute occlusion proximal RCA 4. Successful PTCA/DES x 2 proximal and mid RCA 5. Preserved LV systolic function EF 50-55% He was placed on Brilinta with plans to continue DAPT. Beta blocker was started but then stopped due to bradycardia and concern given inferior STEMI presentation with bradycardia. Of note he has been intolerant of BB in the past. Troponins 4.3->19.6->23.6->13.4. Atorvastatin was titrated to 80mg  daily. LDL 89. 2D Echo showed EF 40-98%55-60%, mild LVH, trivial AI.  ACEI was considered but not started due to soft BPs.   Comes in today. Here alone. Doing well. [place] - [place] - walked for about an hour every day. No chest pain. Not short of breath. No problems with his groin. Not interested in cardiac rehab. Tolerating his medicines but got a large bottle of NTG that really is hard to carry around. Notes that the Brilinta costs too much but he is able to continue. Asking about Plavix.   Past Medical History    Diagnosis Date  . Coronary artery disease     a. s/p 6V CABG 11/18/08. b. acute inferior STEMI secondary to acute occlusion proximal RCA s/p successful PTCA/DES x 2 proximal and mid RCA, EF 50-55%  . Hyperlipidemia   . Hypertension   . Sleep apnea     moderate by prior sleep study, symptoms resolved with weight loss  . Atrial fibrillation     once in setting of a spider bite 1/10, without recurrence  . Carotid artery occlusion     0-39% stenosis by US 4/11  . Sinus bradycardia     a. Not on BB due to this. Previously did not tolerate BB.  Marland Kitchen. Hyperglycemia     Past Surgical History  Procedure Laterality Date  . Tonsillectomy    . Knee arthroscopy    . Coronary artery bypass graft  1/10    by Dr Tyrone SageGerhardt  . Left heart catheterization with coronary angiogram N/A 01/23/2015    Procedure: LEFT HEART CATHETERIZATION WITH CORONARY ANGIOGRAM;  Surgeon: Kathleene Hazelhristopher D McAlhany, MD;  [facility];  Service: Cardiovascular;  Laterality: N/A;  . Percutaneous coronary stent intervention (pci-s)  01/23/2015    Procedure: PERCUTANEOUS CORONARY STENT INTERVENTION (PCI-S);  Surgeon: Kathleene Hazelhristopher D McAlhany, MD;  [facility];  Service: Cardiovascular;;     Medications: Current Outpatient Prescriptions  Medication Sig Dispense Refill  . aspirin 81 MG tablet Take 1 tablet (81 mg  total) by mouth daily. 30 tablet 11  . atorvastatin (LIPITOR) 80 MG tablet Take 1 tablet (80 mg total) by mouth every evening. 30 tablet 6  . Cholecalciferol (VITAMIN D-3 PO) Take 2,000 Units by mouth.     . nitroGLYCERIN (NITROSTAT) 0.4 MG SL tablet Place 1 tablet (0.4 mg total) under the tongue every 5 (five) minutes as needed for chest pain (up to 3 doses). 25 tablet 3  . ticagrelor (BRILINTA) 90 MG TABS tablet Take 1 tablet (90 mg total) by mouth 2 (two) times daily. 60 tablet 11   No current facility-administered medications for this visit.    Allergies: No Known Allergies  Social History: The  patient  reports that he has never smoked. He has never used smokeless tobacco. He reports that he does not drink alcohol or use illicit drugs.   Family History: The patient's family history includes Emphysema in his father; Stroke in his mother.   Review of Systems: Please see the history of present illness.    All other systems are reviewed and negative.   Physical Exam: VS:  BP 122/78 mmHg  Pulse 65  Resp 18  Ht  (1.778 m)  Wt 200 lb (90.719 kg)  BMI 28.70 kg/m2  SpO2 97% .  BMI Body mass index is 28.7 kg/(m^2).  Wt Readings from Last 3 Encounters:  02/03/15 200 lb (90.719 kg)  01/24/15 198 lb 13.7 oz (90.2 kg)  10/17/13 190 lb 6.4 oz (86.365 kg)    General: Pleasant. Well developed, well nourished and in no acute distress.  HEENT: Normal. Neck: Supple, no JVD, carotid bruits, or masses noted.  Cardiac: Regular rate and rhythm. No murmurs, rubs, or gallops. No edema.  Respiratory:  Lungs are clear to auscultation bilaterally with normal work of breathing.  GI: Soft and nontender.  MS: No deformity or atrophy. Gait and ROM intact. Skin: Warm and dry. Color is normal.  Neuro:  Strength and sensation are intact and no gross focal deficits noted.  Psych: Alert, appropriate and with normal affect.   LABORATORY DATA:  EKG:  EKG is not ordered today.  Lab Results  Component Value Date   WBC 9.2 01/24/2015   HGB 14.3 01/24/2015   HCT 42.1 01/24/2015   PLT 154 01/24/2015   GLUCOSE 112* 01/24/2015   CHOL 153 01/24/2015   TRIG 106 01/24/2015   HDL 43 01/24/2015   LDLCALC 89 01/24/2015   ALT 27 01/24/2015   AST 66* 01/24/2015   NA 139 01/24/2015   K 3.9 01/24/2015   CL 106 01/24/2015   CREATININE 0.95 01/24/2015   BUN 12 01/24/2015   CO2 25 01/24/2015   TSH 2.152 01/23/2015   INR 1.04 01/24/2015   HGBA1C 5.9* 01/23/2015    BNP (last 3 results)  Recent Labs  01/23/15 1034  BNP 75.4    ProBNP (last 3 results) No results for input(s): PROBNP in the  last 8760 hours.  Lab Results  Component Value Date   CKTOTAL 450* 11/19/2008   CKMB 19.0* 11/19/2008   TROPONINI 13.40* 01/24/2015     Other Studies Reviewed Today: Cardiac Catheterization Operative Report  PCI Note: The patient was given a weight based bolus of Angiomax and a drip was started. He was given Brilinta 180 mg po x 1. When the ACT was over 200 I passed a Cougar IC wire down the RCA. A 2.5 x 15 mm balloon was used to pre-dilate the mid and then proximal vessel and flow was  re-established. I then deployed a 2.75 x 24 mm Promus Premier in the mid vessel. A 3.0 x 20 mm Promus Premier DES was deployed in the proximal vessel back to the ostium, overlapping with the mid stent. The mid stent was post-dilated with a 3.0 x 20 mm [place] balloon x 1. The proximal stented segment was post-dilated with a 3.5 x 12 mm Lindsey balloon x 1. The stenosis was taken from 100% down to 0%.   A pigtail catheter was used to perform a left ventricular angiogram. There were no immediate complications. Angioseal right femoral artery. The patient was taken to the recovery area in stable condition.   Hemodynamic Findings: Central aortic pressure: 119/61 Left ventricular pressure: 128/3/20  Angiographic Findings:  Left main: Diffuse 50% stenosis.   Left Anterior Descending Artery: Large caliber vessel that courses to the apex. There is severe stenosis in the mid vessel (80%) with competitive flow seen in the mid and distal vessel from the patent IMA graft.   Circumflex Artery: Large caliber vessel with proximal 50% stenosis. 100% mid occlusion. The three obtuse marginal branches fill from the patent vein graft.   Right Coronary Artery: 100% proximal occlusion, large dominant vessel. The vein graft is no longer patent to the distal vessel.   Graft Anatomy:  SVG sequential to OM1, OM2, OM3 is patent SVG sequential to distal RCA, PDA is occluded LIMA to mid LAD is patent  Left Ventricular Angiogram:  LVEF=50-55%.   Impression: 1. Severe triple vessel CAD s/p 6V CABG with 4/6 patent grafts 2. Occluded vein graft to RCA, likely chronic 3. Acute inferior STEMI secondary to acute occlusion proximal RCA 4. Successful PTCA/DES x 2 proximal and mid RCA 5. Preserved LV systolic function   Echo Study Conclusions from March 2016  - Left ventricle: The cavity size was normal. Wall thickness was increased in a pattern of mild LVH. Systolic function was normal. The estimated ejection fraction was in the range of 55% to 60%. - Aortic valve: There was trivial regurgitation.   Assessment/Plan: 1. Inferior STEMI with PCI to RCA - on DAPT with Brilinta - he is agreeable to continuing Brilinta. I suspect he will be on DAPT indefinitely - and would switch to Plavix at that time. He is agreeable. Does not want to go to cardiac rehab. His EF is normal.   2. HLD -  On higher dose of statin. Needs fasting labs on return  3. PAF - remote occurrence  4. HTN -  BP looks great on his current regimen. I do not feel we have room to add ACE  Current medicines are reviewed with the patient today.  The patient does not have concerns regarding medicines other than what has been noted above.  The following changes have been made:  See above.  Labs/ tests ordered today include:    Orders Placed This Encounter  Procedures  . Basic metabolic panel  . CBC     Disposition:   FU with Dr. [name] in 6 weeks with fasting labs  Patient is agreeable to this plan and will call if any problems develop in the interim.   Signed: Rosalio Macadamia, RN, ANP-C 02/03/2015 10:03 AM  [facility] [address] [place], [place]  [address] Phone: [phone] Fax: [phone]

## 2015-02-17 ENCOUNTER — Other Ambulatory Visit: Payer: Self-pay | Admitting: Physician Assistant

## 2015-04-17 ENCOUNTER — Encounter: Payer: Self-pay | Admitting: Internal Medicine

## 2015-04-17 ENCOUNTER — Ambulatory Visit (INDEPENDENT_AMBULATORY_CARE_PROVIDER_SITE_OTHER): Payer: Medicare HMO | Admitting: Internal Medicine

## 2015-04-17 VITALS — BP 124/80 | HR 69 | Ht 70.0 in | Wt 194.8 lb

## 2015-04-17 DIAGNOSIS — E7800 Pure hypercholesterolemia, unspecified: Secondary | ICD-10-CM

## 2015-04-17 DIAGNOSIS — E785 Hyperlipidemia, unspecified: Secondary | ICD-10-CM

## 2015-04-17 DIAGNOSIS — I480 Paroxysmal atrial fibrillation: Secondary | ICD-10-CM

## 2015-04-17 DIAGNOSIS — Z951 Presence of aortocoronary bypass graft: Secondary | ICD-10-CM | POA: Diagnosis not present

## 2015-04-17 DIAGNOSIS — Z955 Presence of coronary angioplasty implant and graft: Secondary | ICD-10-CM | POA: Diagnosis not present

## 2015-04-17 NOTE — Patient Instructions (Signed)
Medication Instructions:  Your physician recommends that you continue on your current medications as directed. Please refer to the Current Medication list given to you today.   Labwork: Your physician recommends that you return for lab work today: LIPID/ LIVER   Testing/Procedures: None ordered  Follow-Up: Your physician recommends that you schedule a follow-up appointment in: 3 months with Norma Fredrickson, NP  Your physician wants you to follow-up in: 6 months with Dr Jacquiline Doe will receive a reminder letter in the mail two months in advance. If you don't receive a letter, please call our office to schedule the follow-up appointment.    Any Other Special Instructions Will Be Listed Below (If Applicable).

## 2015-04-17 NOTE — Progress Notes (Signed)
PCP: Charles Blamer, MD  Charles House is a 74 y.o. male who presents today for routine cardiology followup.  He is s/p recent PCI for inferior STEMI.  He has done well and is currently without any cardiac limitations.  He continues to play golf and work.  He is unaware of any afib.  Today, he denies symptoms of palpitations, chest pain, shortness of breath,  lower extremity edema, dizziness, presyncope, bleeding, or syncope.  The patient is otherwise without complaint today.   Past Medical History  Diagnosis Date  . Coronary artery disease     a. s/p 6V CABG 11/18/08. b. acute inferior STEMI secondary to acute occlusion proximal RCA s/p successful PTCA/DES x 2 proximal and mid RCA, EF 50-55%  . Hyperlipidemia   . Hypertension   . Sleep apnea     moderate by prior sleep study, symptoms resolved with weight loss  . Atrial fibrillation     once in setting of a spider bite 1/10, without recurrence  . Carotid artery occlusion     0-39% stenosis by [place] 4/11  . Sinus bradycardia     a. Not on BB due to this. Previously did not tolerate BB.  Marland Kitchen Hyperglycemia    Past Surgical History  Procedure Laterality Date  . Tonsillectomy    . Knee arthroscopy    . Coronary artery bypass graft  1/10    by Dr Tyrone Sage  . Left heart catheterization with coronary angiogram N/A 01/23/2015    Procedure: LEFT HEART CATHETERIZATION WITH CORONARY ANGIOGRAM;  Surgeon: Kathleene Hazel, MD;  Location: [facility] CATH LAB;  Service: Cardiovascular;  Laterality: N/A;  . Percutaneous coronary stent intervention (pci-s)  01/23/2015    Procedure: PERCUTANEOUS CORONARY STENT INTERVENTION (PCI-S);  Surgeon: Kathleene Hazel, MD;  Location: [facility] CATH LAB;  Service: Cardiovascular;;    Current Outpatient Prescriptions  Medication Sig Dispense Refill  . aspirin 81 MG tablet Take 1 tablet (81 mg total) by mouth daily. 30 tablet 11  . atorvastatin (LIPITOR) 80 MG tablet Take 1 tablet (80 mg total) by mouth every evening. 30  tablet 6  . BRILINTA 90 MG TABS tablet TAKE 1 TABLET BY MOUTH TWICE A DAY 60 tablet 6  . Cholecalciferol (VITAMIN D-3 PO) Take 2,000 Units by mouth daily.     . nitroGLYCERIN (NITROSTAT) 0.4 MG SL tablet Place 1 tablet (0.4 mg total) under the tongue every 5 (five) minutes as needed for chest pain (up to 3 doses). 25 tablet 3   No current facility-administered medications for this visit.    Physical Exam: Filed Vitals:   04/17/15 1609  BP: 124/80  Pulse: 69  Height: 5\' 10"  (1.778 m)  Weight: 88.361 kg (194 lb 12.8 oz)    GEN- The patient is well appearing, alert and oriented x 3 today.   Head- normocephalic, atraumatic Eyes-  Sclera clear, conjunctiva pink Ears- hearing intact Oropharynx- clear Lungs- Clear to ausculation bilaterally, normal work of breathing Heart- Regular rate and rhythm, no murmurs, rubs or gallops, PMI not laterally displaced GI- soft, NT, ND, + BS Extremities- no clubbing, cyanosis, or edema  ekg today reveals sinus rhythm, otherwise normal ekg  Assessment and Plan:   CORONARY ARTERY DISEASE  Doing well s/p recent PCI of the RCA for inferior STEMI Check fasting lipids and LFTs today  Continue ASA, Brilinta and high dose statin Has not tolerated Beta blockers previously  HYPERLIPIDEMIA  Fasting lipids and LFTs today Prior cardotid US revealed bilateral stenosis 0-39%, repeat  study in 2 years  AF Only a single episode in 2010.  For now, would not anticoagulation particularly given that he is on brillinta  Follow-up with Norma Fredrickson in 3 months I will see again in 6 months

## 2015-04-18 LAB — HEPATIC FUNCTION PANEL
ALBUMIN: 4.5 g/dL (ref 3.5–5.2)
ALT: 29 U/L (ref 0–53)
AST: 27 U/L (ref 0–37)
Alkaline Phosphatase: 116 U/L (ref 39–117)
Bilirubin, Direct: 0.3 mg/dL (ref 0.0–0.3)
Total Bilirubin: 1.7 mg/dL — ABNORMAL HIGH (ref 0.2–1.2)
Total Protein: 7.6 g/dL (ref 6.0–8.3)

## 2015-04-18 LAB — LIPID PANEL
Cholesterol: 142 mg/dL (ref 0–200)
HDL: 46.5 mg/dL (ref >39.00)
LDL Cholesterol: 81 mg/dL (ref 0–99)
NonHDL: 95.5
TRIGLYCERIDES: 73 mg/dL (ref 0.0–149.0)
Total CHOL/HDL Ratio: 3
VLDL: 14.6 mg/dL (ref 0.0–40.0)

## 2015-04-24 ENCOUNTER — Encounter: Payer: Self-pay | Admitting: *Deleted

## 2015-04-30 ENCOUNTER — Other Ambulatory Visit: Payer: Self-pay | Admitting: *Deleted

## 2015-04-30 MED ORDER — ATORVASTATIN CALCIUM 80 MG PO TABS: 80.0000 mg | ORAL_TABLET | Freq: Every evening | ORAL | Status: DC

## 2015-04-30 MED ORDER — TICAGRELOR 90 MG PO TABS: 90.0000 mg | ORAL_TABLET | Freq: Two times a day (BID) | ORAL | Status: DC

## 2015-05-27 ENCOUNTER — Telehealth: Payer: Self-pay | Admitting: Internal Medicine

## 2015-05-27 NOTE — Telephone Encounter (Signed)
Patient has not received a call back yet.  He is leaving in the morning for [place].

## 2015-05-27 NOTE — Telephone Encounter (Signed)
Returned patients call.  Clarified patients dose of Brilinta 90 mg, one pill twice a day.  Patient repeated instructions

## 2015-05-27 NOTE — Telephone Encounter (Signed)
Patient has a question regarding the dosage of his Brilinta.  Please call today-- his is going out of the country tomorrow.

## 2015-07-16 ENCOUNTER — Ambulatory Visit: Payer: Medicare HMO | Admitting: Nurse Practitioner

## 2015-07-29 ENCOUNTER — Ambulatory Visit (INDEPENDENT_AMBULATORY_CARE_PROVIDER_SITE_OTHER): Payer: Medicare HMO | Admitting: Nurse Practitioner

## 2015-07-29 ENCOUNTER — Encounter: Payer: Self-pay | Admitting: Nurse Practitioner

## 2015-07-29 VITALS — BP 128/74 | HR 67 | Ht 70.0 in | Wt 196.1 lb

## 2015-07-29 DIAGNOSIS — I480 Paroxysmal atrial fibrillation: Secondary | ICD-10-CM | POA: Diagnosis not present

## 2015-07-29 DIAGNOSIS — E7800 Pure hypercholesterolemia, unspecified: Secondary | ICD-10-CM

## 2015-07-29 DIAGNOSIS — E785 Hyperlipidemia, unspecified: Secondary | ICD-10-CM

## 2015-07-29 DIAGNOSIS — Z955 Presence of coronary angioplasty implant and graft: Secondary | ICD-10-CM

## 2015-07-29 NOTE — Patient Instructions (Addendum)
We will be checking the following labs today - NONE   Medication Instructions:    Continue with your current medicines.     Testing/Procedures To Be Arranged:  N/A  Follow-Up:   See me in 6 months with fasting labs    Other Special Instructions:   N/A  Call the [facility] office at [phone] if you have any questions, problems or concerns.      

## 2015-07-29 NOTE — Progress Notes (Signed)
CARDIOLOGY OFFICE NOTE  Date:  07/29/2015    Charles House Date of Birth: [birth year] Medical Record #[id number]  PCP:  [name], MD  Cardiologist:  Allred    Chief Complaint  Patient presents with  . Coronary Artery Disease    Follow up visit - seen for Dr. [name]    History of Present Illness: Charles House is a 74 y.o. male who presents today for a follow up visit. Seen for Dr. [name]. He has a history of CAD s/p CABG x 6 in 2010 per EBG, HTN, HLD, OSA, remote PAF, and carotid artery disease (last doppler in 11/2014).  He presented to [facility] on 01/23/15 with chest pain and found to have inferior STEMI. He was taken for emergent cardiac catheterization with possible PCI. He was still experiencing 8/10 chest pain upon arrival to the cath lab. Cardiac cath demonstrated: Impression: 1. Severe triple vessel CAD s/p 6V CABG with 4/6 patent grafts 2. Occluded vein graft to RCA, likely chronic 3. Acute inferior STEMI secondary to acute occlusion proximal RCA 4. Successful PTCA/DES x 2 proximal and mid RCA 5. Preserved LV systolic function EF 50-55% He was placed on Brilinta with plans to continue DAPT. Beta blocker was started but then stopped due to bradycardia and concern given inferior STEMI presentation with bradycardia. Of note he has been intolerant of BB in the past.  Atorvastatin was titrated to  daily. LDL 89. 2D Echo showed EF 40-98%, mild LVH, trivial AI. ACEI was considered but not started due to soft BPs.   Saw Dr. [name] back in June - was doing well. Labs checked.   Comes in today. Here alone. Doing well. Just returned from the beach. No chest pain. Remains active. He has had a cough that has persisted over the past few months - has seen primary care twice - treated with Zpak and he notes that he is considerably better but cough not resolved. Energy level is ok. Tolerating his medicines without issue.   Past Medical History  Diagnosis Date  . Coronary  artery disease     a. s/p 6V CABG 11/18/08. b. acute inferior STEMI secondary to acute occlusion proximal RCA s/p successful PTCA/DES x 2 proximal and mid RCA, EF 50-55%  . Hyperlipidemia   . Hypertension   . Sleep apnea     moderate by prior sleep study, symptoms resolved with weight loss  . Atrial fibrillation     once in setting of a spider bite 1/10, without recurrence  . Carotid artery occlusion     0-39% stenosis by [place] 4/11  . Sinus bradycardia     a. Not on BB due to this. Previously did not tolerate BB.  Marland Kitchen Hyperglycemia     Past Surgical History  Procedure Laterality Date  . Tonsillectomy    . Knee arthroscopy    . Coronary artery bypass graft  1/10    by Dr Tyrone Sage  . Left heart catheterization with coronary angiogram N/A 01/23/2015    Procedure: LEFT HEART CATHETERIZATION WITH CORONARY ANGIOGRAM;  Surgeon: Kathleene Hazel, MD;  Location: [facility] CATH LAB;  Service: Cardiovascular;  Laterality: N/A;  . Percutaneous coronary stent intervention (pci-s)  01/23/2015    Procedure: PERCUTANEOUS CORONARY STENT INTERVENTION (PCI-S);  Surgeon: Kathleene Hazel, MD;  Location: [facility] CATH LAB;  Service: Cardiovascular;;     Medications: Current Outpatient Prescriptions  Medication Sig Dispense Refill  . aspirin 81 MG tablet Take 1 tablet (81 mg total) by mouth  daily. 30 tablet 11  . atorvastatin (LIPITOR) 80 MG tablet Take 1 tablet (80 mg total) by mouth every evening. 90 tablet 1  . Cholecalciferol (VITAMIN D-3 PO) Take 2,000 Units by mouth daily.     . nitroGLYCERIN (NITROSTAT) 0.4 MG SL tablet Place 1 tablet (0.4 mg total) under the tongue every 5 (five) minutes as needed for chest pain (up to 3 doses). 25 tablet 3  . ticagrelor (BRILINTA) 90 MG TABS tablet Take 1 tablet (90 mg total) by mouth 2 (two) times daily. 180 tablet 1   No current facility-administered medications for this visit.    Allergies: No Known Allergies  Social History: The patient  reports that he  has never smoked. He has never used smokeless tobacco. He reports that he does not drink alcohol or use illicit drugs.   Family History: The patient's family history includes Emphysema in his father; Stroke in his mother.   Review of Systems: Please see the history of present illness.   Otherwise, the review of systems is positive for none.   All other systems are reviewed and negative.   Physical Exam: VS:  BP 128/74 mmHg  Pulse 67  Ht  (1.778 m)  Wt 196 lb 1.9 oz (88.959 kg)  BMI 28.14 kg/m2  SpO2 96% .  BMI Body mass index is 28.14 kg/(m^2).  Wt Readings from Last 3 Encounters:  07/29/15 196 lb 1.9 oz (88.959 kg)  04/17/15 194 lb 12.8 oz (88.361 kg)  02/03/15 200 lb (90.719 kg)    General: Pleasant. Well developed, well nourished and in no acute distress.  HEENT: Normal. Neck: Supple, no JVD, carotid bruits, or masses noted.  Cardiac: Regular rate and rhythm. No murmurs, rubs, or gallops. No edema.  Respiratory:  Lungs are clear to auscultation bilaterally with normal work of breathing.  GI: Soft and nontender.  MS: No deformity or atrophy. Gait and ROM intact. Skin: Warm and dry. Color is normal.  Neuro:  Strength and sensation are intact and no gross focal deficits noted.  Psych: Alert, appropriate and with normal affect.   LABORATORY DATA:  EKG:  EKG is not ordered today.   Lab Results  Component Value Date   WBC 5.9 02/03/2015   HGB 14.5 02/03/2015   HCT 42.6 02/03/2015   PLT 201.0 02/03/2015   GLUCOSE 107* 02/03/2015   CHOL 142 04/17/2015   TRIG 73.0 04/17/2015   HDL 46.50 04/17/2015   LDLCALC 81 04/17/2015   ALT 29 04/17/2015   AST 27 04/17/2015   NA 137 02/03/2015   K 4.0 02/03/2015   CL 105 02/03/2015   CREATININE 1.00 02/03/2015   BUN 16 02/03/2015   CO2 26 02/03/2015   TSH 2.152 01/23/2015   INR 1.04 01/24/2015   HGBA1C 5.9* 01/23/2015    BNP (last 3 results)  Recent Labs  01/23/15 1034  BNP 75.4    ProBNP (last 3 results) No  results for input(s): PROBNP in the last 8760 hours.   Other Studies Reviewed Today:  Cardiac Catheterization Operative Report  PCI Note: The patient was given a weight based bolus of Angiomax and a drip was started. He was given Brilinta 180 mg po x 1. When the ACT was over 200 I passed a Cougar IC wire down the RCA. A 2.5 x 15 mm balloon was used to pre-dilate the mid and then proximal vessel and flow was re-established. I then deployed a 2.75 x 24 mm Promus Premier in the mid vessel.  A 3.0 x 20 mm Promus Premier DES was deployed in the proximal vessel back to the ostium, overlapping with the mid stent. The mid stent was post-dilated with a 3.0 x 20 mm [place] balloon x 1. The proximal stented segment was post-dilated with a 3.5 x 12 mm [place] balloon x 1. The stenosis was taken from 100% down to 0%.   A pigtail catheter was used to perform a left ventricular angiogram. There were no immediate complications. Angioseal right femoral artery. The patient was taken to the recovery area in stable condition.   Hemodynamic Findings: Central aortic pressure: 119/61 Left ventricular pressure: 128/3/20  Angiographic Findings:  Left main: Diffuse 50% stenosis.   Left Anterior Descending Artery: Large caliber vessel that courses to the apex. There is severe stenosis in the mid vessel (80%) with competitive flow seen in the mid and distal vessel from the patent IMA graft.   Circumflex Artery: Large caliber vessel with proximal 50% stenosis. 100% mid occlusion. The three obtuse marginal branches fill from the patent vein graft.   Right Coronary Artery: 100% proximal occlusion, large dominant vessel. The vein graft is no longer patent to the distal vessel.   Graft Anatomy:  SVG sequential to OM1, OM2, OM3 is patent SVG sequential to distal RCA, PDA is occluded LIMA to mid LAD is patent  Left Ventricular Angiogram: LVEF=50-55%.   Impression: 1. Severe triple vessel CAD s/p 6V CABG with 4/6 patent  grafts 2. Occluded vein graft to RCA, likely chronic 3. Acute inferior STEMI secondary to acute occlusion proximal RCA 4. Successful PTCA/DES x 2 proximal and mid RCA 5. Preserved LV systolic function   Echo Study Conclusions from March 2016  - Left ventricle: The cavity size was normal. Wall thickness was increased in a pattern of mild LVH. Systolic function was normal. The estimated ejection fraction was in the range of 55% to 60%. - Aortic valve: There was trivial regurgitation.   Assessment/Plan: 1. CAD with Inferior STEMI with PCI to RCA from 12/2014 - on DAPT with Brilinta - he has 4/6 patent grafts - He will be on DAPT indefinitely - and would switch to Plavix after one year.   2. HLD - lipids look great - checked in June - no change in current regimen.   3. PAF - remote occurrence  4. HTN - BP looks great on his current regimen. No change with his current regimen.   5. Cough - ok to use Mucinex. If persists, I have suggested he get a CXR.   Current medicines are reviewed with the patient today.  The patient does not have concerns regarding medicines other than what has been noted above.  The following changes have been made:  See above.  Labs/ tests ordered today include:   No orders of the defined types were placed in this encounter.     Disposition:   FU with me in 6 months with fasting labs.    Patient is agreeable to this plan and will call if any problems develop in the interim.   Signed: Rosalio Macadamia, RN, ANP-C 07/29/2015 8:41 AM  [facility] [address] [place], [place]  [address] Phone: [phone] Fax: [phone]

## 2015-10-15 ENCOUNTER — Encounter: Payer: Self-pay | Admitting: Internal Medicine

## 2015-10-15 ENCOUNTER — Ambulatory Visit (INDEPENDENT_AMBULATORY_CARE_PROVIDER_SITE_OTHER): Payer: Medicare HMO | Admitting: Internal Medicine

## 2015-10-15 VITALS — BP 120/72 | HR 68 | Ht 70.0 in | Wt 197.2 lb

## 2015-10-15 DIAGNOSIS — I480 Paroxysmal atrial fibrillation: Secondary | ICD-10-CM | POA: Diagnosis not present

## 2015-10-15 DIAGNOSIS — I2510 Atherosclerotic heart disease of native coronary artery without angina pectoris: Secondary | ICD-10-CM

## 2015-10-15 NOTE — Patient Instructions (Signed)

## 2015-10-17 NOTE — Progress Notes (Signed)
PCP: [name], MD  Charles House is a 74 y.o. male who presents today for routine cardiology followup.  He continues to play golf and work.  He is unaware of any afib.  Today, he denies symptoms of palpitations, chest pain, shortness of breath,  lower extremity edema, dizziness, presyncope, bleeding, or syncope.  The patient is otherwise without complaint today.   Past Medical History  Diagnosis Date  . Coronary artery disease     a. s/p 6V CABG 11/18/08. b. acute inferior STEMI secondary to acute occlusion proximal RCA s/p successful PTCA/DES x 2 proximal and mid RCA, EF 50-55%  . Hyperlipidemia   . Hypertension   . Sleep apnea     moderate by prior sleep study, symptoms resolved with weight loss  . Atrial fibrillation (HCC)     once in setting of a spider bite 1/10, without recurrence  . Carotid artery occlusion     0-39% stenosis by [place] 4/11  . Sinus bradycardia     a. Not on BB due to this. Previously did not tolerate BB.  Marland Kitchen Hyperglycemia    Past Surgical History  Procedure Laterality Date  . Tonsillectomy    . Knee arthroscopy    . Coronary artery bypass graft  1/10    by Dr Tyrone Sage  . Left heart catheterization with coronary angiogram N/A 01/23/2015    Procedure: LEFT HEART CATHETERIZATION WITH CORONARY ANGIOGRAM;  Surgeon: Kathleene Hazel, MD;  Location: [facility] CATH LAB;  Service: Cardiovascular;  Laterality: N/A;  . Percutaneous coronary stent intervention (pci-s)  01/23/2015    Procedure: PERCUTANEOUS CORONARY STENT INTERVENTION (PCI-S);  Surgeon: Kathleene Hazel, MD;  Location: [facility] CATH LAB;  Service: Cardiovascular;;    Current Outpatient Prescriptions  Medication Sig Dispense Refill  . aspirin 81 MG tablet Take 1 tablet (81 mg total) by mouth daily. 30 tablet 11  . atorvastatin (LIPITOR) 80 MG tablet Take 1 tablet (80 mg total) by mouth every evening. 90 tablet 1  . Cholecalciferol (VITAMIN D-3 PO) Take 2,000 Units by mouth daily.     . nitroGLYCERIN  (NITROSTAT) 0.4 MG SL tablet Place 1 tablet (0.4 mg total) under the tongue every 5 (five) minutes as needed for chest pain (up to 3 doses). 25 tablet 3  . ticagrelor (BRILINTA) 90 MG TABS tablet Take 1 tablet (90 mg total) by mouth 2 (two) times daily. 180 tablet 1   No current facility-administered medications for this visit.    Physical Exam: Filed Vitals:   10/15/15 1438  BP: 120/72  Pulse: 68  Height:  (1.778 m)  Weight: 197 lb 3.2 oz (89.449 kg)    GEN- The patient is well appearing, alert and oriented x 3 today.   Head- normocephalic, atraumatic Eyes-  Sclera clear, conjunctiva pink Ears- hearing intact Oropharynx- clear Lungs- Clear to ausculation bilaterally, normal work of breathing Heart- Regular rate and rhythm, no murmurs, rubs or gallops, PMI not laterally displaced GI- soft, NT, ND, + BS Extremities- no clubbing, cyanosis, or edema  ekg today reveals sinus rhythm, otherwise normal ekg  Assessment and Plan:   CORONARY ARTERY DISEASE  Doing well s/p PCI of the RCA 3/16 for inferior STEMI Continue ASA, Brilinta and high dose statin Has not tolerated Beta blockers previously I spoke with Dr Charles House today.  We will plan to stop brilinta and start plavix when he sees Charles House for 1 year follow-up (post MI) in March 2017  HYPERLIPIDEMIA  Prior cardotid US revealed bilateral stenosis  0-39%, repeat study in 1-2 years  AF Only a single episode in 2010.  For now, would not anticoagulation particularly given that he is on brillinta  Follow-up with Charles House in March I will see again in 12 months  Charles RangeJames Braven Wolk MD, [facility] Center[facility]ACC 10/17/2015 11:21 AM

## 2015-11-19 ENCOUNTER — Other Ambulatory Visit: Payer: Self-pay | Admitting: Internal Medicine

## 2015-11-29 ENCOUNTER — Other Ambulatory Visit: Payer: Self-pay | Admitting: Internal Medicine

## 2016-01-27 ENCOUNTER — Ambulatory Visit (INDEPENDENT_AMBULATORY_CARE_PROVIDER_SITE_OTHER): Payer: Medicare HMO | Admitting: Nurse Practitioner

## 2016-01-27 ENCOUNTER — Encounter: Payer: Self-pay | Admitting: Nurse Practitioner

## 2016-01-27 VITALS — BP 142/80 | HR 68 | Ht 70.0 in | Wt 199.8 lb

## 2016-01-27 DIAGNOSIS — I480 Paroxysmal atrial fibrillation: Secondary | ICD-10-CM

## 2016-01-27 DIAGNOSIS — E785 Hyperlipidemia, unspecified: Secondary | ICD-10-CM

## 2016-01-27 DIAGNOSIS — Z955 Presence of coronary angioplasty implant and graft: Secondary | ICD-10-CM | POA: Diagnosis not present

## 2016-01-27 DIAGNOSIS — I2510 Atherosclerotic heart disease of native coronary artery without angina pectoris: Secondary | ICD-10-CM | POA: Diagnosis not present

## 2016-01-27 MED ORDER — CLOPIDOGREL BISULFATE 75 MG PO TABS: 75.0000 mg | ORAL_TABLET | Freq: Every day | ORAL | Status: DC

## 2016-01-27 NOTE — Patient Instructions (Addendum)
We will be checking the following labs today - NONE   Medication Instructions:    Continue with your current medicines. BUT   STOP BRILINTA when you are finished with your current supply  START PLAVIX 75 mg just once a day - this has been sent to your pharmacy.    Testing/Procedures To Be Arranged:  N/A  Follow-Up:   See Dr. Johney FrameAllred and his team going forward in 6 months.     Other Special Instructions:   N/A    If you need a refill on your cardiac medications before your next appointment, please call your pharmacy.   Call the [facility] office at [phone] if you have any questions, problems or concerns.

## 2016-01-27 NOTE — Progress Notes (Signed)
CARDIOLOGY OFFICE NOTE  Date:  01/27/2016    Charles House Date of Birth: 1940-11-23 Medical Record #[id number]  PCP:  [name], MD  Cardiologist:  Allred   Chief Complaint  Patient presents with  . Coronary Artery Disease  . Hypertension  . Hyperlipidemia    3 month check - seen for Dr. [name]    History of Present Illness: Charles House is a 75 y.o. male who presents today for a 6 month check. Seen for Dr. [name] and Dr. Clifton James. He has a history of CAD s/p CABG x 6 in 2010 per EBG, HTN, HLD, OSA, remote PAF, and carotid artery disease (last doppler in 11/2014).  He presented to [facility] on 01/23/15 with chest pain and found to have inferior STEMI. He was taken for emergent cardiac catheterization with possible PCI. He was still experiencing 8/10 chest pain upon arrival to the cath lab. Cardiac cath demonstrated: Impression: 1. Severe triple vessel CAD s/p 6V CABG with 4/6 patent grafts 2. Occluded vein graft to RCA, likely chronic 3. Acute inferior STEMI secondary to acute occlusion proximal RCA 4. Successful PTCA/DES x 2 proximal and mid RCA 5. Preserved LV systolic function EF 50-55% He was placed on Brilinta with plans to continue DAPT. Beta blocker was started but then stopped due to bradycardia and concern given inferior STEMI presentation with bradycardia. Of note he has been intolerant of BB in the past. Atorvastatin was titrated to  daily. LDL 89. 2D Echo showed EF 40-98%, mild LVH, trivial AI. ACEI was considered but not started due to soft BPs.   Saw Dr. [name] back in December - was felt to be doing well.   Comes in today. Here alone. He continues to do well. Asking about changing Brilinta to Plavix today. No chest pain. Not short of breath. BP better at home. Labs are checked by PCP. He continues to exercise regularly. Contemplating moving to the beach. He feels like he is doing well and has no problems or concerns today.    Past Medical History    Diagnosis Date  . Coronary artery disease     a. s/p 6V CABG 11/18/08. b. acute inferior STEMI secondary to acute occlusion proximal RCA s/p successful PTCA/DES x 2 proximal and mid RCA, EF 50-55%  . Hyperlipidemia   . Hypertension   . Sleep apnea     moderate by prior sleep study, symptoms resolved with weight loss  . Atrial fibrillation (HCC)     once in setting of a spider bite 1/10, without recurrence  . Carotid artery occlusion     0-39% stenosis by [place] 4/11  . Sinus bradycardia     a. Not on BB due to this. Previously did not tolerate BB.  Marland Kitchen Hyperglycemia     Past Surgical History  Procedure Laterality Date  . Tonsillectomy    . Knee arthroscopy    . Coronary artery bypass graft  1/10    by Dr Tyrone Sage  . Left heart catheterization with coronary angiogram N/A 01/23/2015    Procedure: LEFT HEART CATHETERIZATION WITH CORONARY ANGIOGRAM;  Surgeon: Kathleene Hazel, MD;  Location: [facility] CATH LAB;  Service: Cardiovascular;  Laterality: N/A;  . Percutaneous coronary stent intervention (pci-s)  01/23/2015    Procedure: PERCUTANEOUS CORONARY STENT INTERVENTION (PCI-S);  Surgeon: Kathleene Hazel, MD;  Location: [facility] CATH LAB;  Service: Cardiovascular;;     Medications: Current Outpatient Prescriptions  Medication Sig Dispense Refill  . aspirin 81 MG tablet Take  1 tablet (81 mg total) by mouth daily. 30 tablet 11  . atorvastatin (LIPITOR) 80 MG tablet TAKE 1 TABLET (80 MG TOTAL) BY MOUTH EVERY EVENING. 90 tablet 3  . Cholecalciferol (VITAMIN D-3 PO) Take 2,000 Units by mouth daily.     . nitroGLYCERIN (NITROSTAT) 0.4 MG SL tablet Place 1 tablet (0.4 mg total) under the tongue every 5 (five) minutes as needed for chest pain (up to 3 doses). 25 tablet 3  . clopidogrel (PLAVIX) 75 MG tablet Take 1 tablet (75 mg total) by mouth daily. 90 tablet 3   No current facility-administered medications for this visit.    Allergies: No Known Allergies  Social History: The patient   reports that he has never smoked. He has never used smokeless tobacco. He reports that he does not drink alcohol or use illicit drugs.   Family History: The patient's family history includes Emphysema in his father; Stroke in his mother.   Review of Systems: Please see the history of present illness.   Otherwise, the review of systems is positive for none.   All other systems are reviewed and negative.   Physical Exam: VS:  BP 142/80 mmHg  Pulse 68  Ht  (1.778 m)  Wt 199 lb 12.8 oz (90.629 kg)  BMI 28.67 kg/m2 .  BMI Body mass index is 28.67 kg/(m^2).  Wt Readings from Last 3 Encounters:  01/27/16 199 lb 12.8 oz (90.629 kg)  10/15/15 197 lb 3.2 oz (89.449 kg)  07/29/15 196 lb 1.9 oz (88.959 kg)    General: Pleasant. Well developed, well nourished and in no acute distress.  HEENT: Normal. Neck: Supple, no JVD, carotid bruits, or masses noted.  Cardiac: Regular rate and rhythm. No murmurs, rubs, or gallops. No edema.  Respiratory:  Lungs are clear to auscultation bilaterally with normal work of breathing.  GI: Soft and nontender.  MS: No deformity or atrophy. Gait and ROM intact. Skin: Warm and dry. Color is normal.  Neuro:  Strength and sensation are intact and no gross focal deficits noted.  Psych: Alert, appropriate and with normal affect.   LABORATORY DATA:  EKG:  EKG is not ordered today.  Lab Results  Component Value Date   WBC 5.9 02/03/2015   HGB 14.5 02/03/2015   HCT 42.6 02/03/2015   PLT 201.0 02/03/2015   GLUCOSE 107* 02/03/2015   CHOL 142 04/17/2015   TRIG 73.0 04/17/2015   HDL 46.50 04/17/2015   LDLCALC 81 04/17/2015   ALT 29 04/17/2015   AST 27 04/17/2015   NA 137 02/03/2015   K 4.0 02/03/2015   CL 105 02/03/2015   CREATININE 1.00 02/03/2015   BUN 16 02/03/2015   CO2 26 02/03/2015   TSH 2.152 01/23/2015   INR 1.04 01/24/2015   HGBA1C 5.9* 01/23/2015    BNP (last 3 results) No results for input(s): BNP in the last 8760 hours.  ProBNP  (last 3 results) No results for input(s): PROBNP in the last 8760 hours.   Other Studies Reviewed Today: Cardiac Catheterization Operative Report  PCI Note: The patient was given a weight based bolus of Angiomax and a drip was started. He was given Brilinta 180 mg po x 1. When the ACT was over 200 I passed a Cougar IC wire down the RCA. A 2.5 x 15 mm balloon was used to pre-dilate the mid and then proximal vessel and flow was re-established. I then deployed a 2.75 x 24 mm Promus Premier in the mid vessel. A  3.0 x 20 mm Promus Premier DES was deployed in the proximal vessel back to the ostium, overlapping with the mid stent. The mid stent was post-dilated with a 3.0 x 20 mm [place] balloon x 1. The proximal stented segment was post-dilated with a 3.5 x 12 mm [place] balloon x 1. The stenosis was taken from 100% down to 0%.   A pigtail catheter was used to perform a left ventricular angiogram. There were no immediate complications. Angioseal right femoral artery. The patient was taken to the recovery area in stable condition.   Angiographic Findings:  Left main: Diffuse 50% stenosis.   Left Anterior Descending Artery: Large caliber vessel that courses to the apex. There is severe stenosis in the mid vessel (80%) with competitive flow seen in the mid and distal vessel from the patent IMA graft.   Circumflex Artery: Large caliber vessel with proximal 50% stenosis. 100% mid occlusion. The three obtuse marginal branches fill from the patent vein graft.   Right Coronary Artery: 100% proximal occlusion, large dominant vessel. The vein graft is no longer patent to the distal vessel.   Graft Anatomy:  SVG sequential to OM1, OM2, OM3 is patent SVG sequential to distal RCA, PDA is occluded LIMA to mid LAD is patent  Left Ventricular Angiogram: LVEF=50-55%.   Impression: 1. Severe triple vessel CAD s/p 6V CABG with 4/6 patent grafts 2. Occluded vein graft to RCA, likely chronic 3. Acute inferior STEMI  secondary to acute occlusion proximal RCA 4. Successful PTCA/DES x 2 proximal and mid RCA 5. Preserved LV systolic function   Echo Study Conclusions from March 2016  - Left ventricle: The cavity size was normal. Wall thickness was increased in a pattern of mild LVH. Systolic function was normal. The estimated ejection fraction was in the range of 55% to 60%. - Aortic valve: There was trivial regurgitation.    Assessment/Plan:  CORONARY ARTERY DISEASE  Remote CABG back in 2010 by EBG Doing well s/p PCI of the RCA 3/16 for inferior STEMI - now one year out Continue ASA and high dose statin and CV risk factor modification. Has not tolerated Beta blockers previously I will have him stop his Brilinta when finished with his current supply and will switch over to Plavix 75 mg daily as per prior discussion with Dr. Johney FrameAllred and Dr. Clifton JamesMcAlhany.   HYPERLIPIDEMIA  His labs are checked by his PCP. He remains on statin therapy.  Carotid disease - Prior cardotid US from 11/2014 revealed bilateral stenosis 0-39%, repeat study in 2018.  AF Only a single episode in 2010. For now, would not anticoagulation particularly given that he is on DAPT. He has had no recurrence.  HTN Reports better BP control at home. He will continue to monitor.   Current medicines are reviewed with the patient today.  The patient does not have concerns regarding medicines other than what has been noted above.  The following changes have been made:  See above.  Labs/ tests ordered today include:   No orders of the defined types were placed in this encounter.     Disposition:   FU with Dr. Johney FrameAllred and his team going forward.   Patient is agreeable to this plan and will call if any problems develop in the interim.   Signed: Rosalio MacadamiaLori C. Niclas Markell, RN, ANP-C 01/27/2016 8:13 AM  [facility] [address] [place], [place]  [id number] Phone: [phone] Fax: [phone]

## 2016-04-12 ENCOUNTER — Other Ambulatory Visit: Payer: Self-pay | Admitting: Nurse Practitioner

## 2016-08-12 ENCOUNTER — Ambulatory Visit (INDEPENDENT_AMBULATORY_CARE_PROVIDER_SITE_OTHER): Payer: Medicare HMO | Admitting: Internal Medicine

## 2016-08-12 ENCOUNTER — Encounter: Payer: Self-pay | Admitting: Internal Medicine

## 2016-08-12 VITALS — BP 127/77 | HR 62 | Ht 70.0 in | Wt 199.0 lb

## 2016-08-12 DIAGNOSIS — I2510 Atherosclerotic heart disease of native coronary artery without angina pectoris: Secondary | ICD-10-CM

## 2016-08-12 DIAGNOSIS — I480 Paroxysmal atrial fibrillation: Secondary | ICD-10-CM

## 2016-08-12 DIAGNOSIS — E785 Hyperlipidemia, unspecified: Secondary | ICD-10-CM

## 2016-08-12 LAB — HEPATIC FUNCTION PANEL
ALBUMIN: 4.2 g/dL (ref 3.6–5.1)
ALT: 27 U/L (ref 9–46)
AST: 26 U/L (ref 10–35)
Alkaline Phosphatase: 92 U/L (ref 40–115)
Bilirubin, Direct: 0.3 mg/dL — ABNORMAL HIGH (ref <=0.2)
Indirect Bilirubin: 1 mg/dL (ref 0.2–1.2)
Total Bilirubin: 1.3 mg/dL — ABNORMAL HIGH (ref 0.2–1.2)
Total Protein: 6.8 g/dL (ref 6.1–8.1)

## 2016-08-12 LAB — LIPID PANEL
CHOL/HDL RATIO: 2.7 ratio (ref <=5.0)
CHOLESTEROL: 137 mg/dL (ref 125–200)
HDL: 50 mg/dL (ref >=40)
LDL Cholesterol: 77 mg/dL (ref <130)
Triglycerides: 52 mg/dL (ref <150)
VLDL: 10 mg/dL (ref <30)

## 2016-08-12 NOTE — Progress Notes (Signed)
PCP: [name], MD  Charles House is a 75 y.o. male who presents today for routine cardiology followup.  He continues to play golf and work.  He is unaware of any afib. He denies ischemic symptoms.  Today, he denies symptoms of palpitations, chest pain, shortness of breath,  lower extremity edema, dizziness, presyncope, bleeding, or syncope.  The patient is otherwise without complaint today.   Past Medical History:  Diagnosis Date  . Atrial fibrillation (HCC)    once in setting of a spider bite 1/10, without recurrence  . Carotid artery occlusion    0-39% stenosis by [place] 4/11  . Coronary artery disease    a. s/p 6V CABG 11/18/08. b. acute inferior STEMI secondary to acute occlusion proximal RCA s/p successful PTCA/DES x 2 proximal and mid RCA, EF 50-55%  . Hyperglycemia   . Hyperlipidemia   . Hypertension   . Sinus bradycardia    a. Not on BB due to this. Previously did not tolerate BB.  . Sleep apnea    moderate by prior sleep study, symptoms resolved with weight loss   Past Surgical History:  Procedure Laterality Date  . CORONARY ARTERY BYPASS GRAFT  1/10   by Dr Tyrone Sage  . KNEE ARTHROSCOPY    . LEFT HEART CATHETERIZATION WITH CORONARY ANGIOGRAM N/A 01/23/2015   Procedure: LEFT HEART CATHETERIZATION WITH CORONARY ANGIOGRAM;  Surgeon: Kathleene Hazel, MD;  Location: [facility] CATH LAB;  Service: Cardiovascular;  Laterality: N/A;  . PERCUTANEOUS CORONARY STENT INTERVENTION (PCI-S)  01/23/2015   Procedure: PERCUTANEOUS CORONARY STENT INTERVENTION (PCI-S);  Surgeon: Kathleene Hazel, MD;  Location: [facility] CATH LAB;  Service: Cardiovascular;;  . TONSILLECTOMY      Current Outpatient Prescriptions  Medication Sig Dispense Refill  . aspirin 81 MG tablet Take 1 tablet (81 mg total) by mouth daily. 30 tablet 11  . atorvastatin (LIPITOR) 80 MG tablet TAKE 1 TABLET (80 MG TOTAL) BY MOUTH EVERY EVENING. 90 tablet 3  . Cholecalciferol (VITAMIN D-3 PO) Take 2,000 Units by mouth daily.      . clopidogrel (PLAVIX) 75 MG tablet Take 1 tablet (75 mg total) by mouth daily. 90 tablet 3  . nitroGLYCERIN (NITROSTAT) 0.4 MG SL tablet PLACE 1 TABLET UNDER THE TONGUE EVERY 5 MINUTES AS NEEDED FOR CHEST PAIN (UP TO 3 DOSES) 25 tablet 1   No current facility-administered medications for this visit.     Physical Exam: Vitals:   08/12/16 0938  BP: 127/77  Pulse: 62  Weight: 199 lb (90.3 kg)  Height: 5\' 10"  (1.778 m)    GEN- The patient is well appearing, alert and oriented x 3 today.   Head- normocephalic, atraumatic Eyes-  Sclera clear, conjunctiva pink Ears- hearing intact Oropharynx- clear Lungs- Clear to ausculation bilaterally, normal work of breathing Heart- Regular rate and rhythm, no murmurs, rubs or gallops, PMI not laterally displaced GI- soft, NT, ND, + BS Extremities- no clubbing, cyanosis, or edema  ekg today reveals sinus rhythm, nonspecific ST/T changes  Assessment and Plan:  CORONARY ARTERY DISEASE  Doing well s/p PCI of the RCA 3/16 for inferior STEMI Continue ASA, plavix and high dose statin Has not tolerated Beta blockers previously Will obtain lfts/ lipids  HYPERLIPIDEMIA  Will obtain lfts/ lipids Prior cardotid US revealed bilateral stenosis 0-39%, repeat study next year  AF Only a single episode in 2010.  For now, would not anticoagulation   I will see again in 12 months.  He will contact my office if problems  arise in the future.  Hillis RangeJames Adysen Raphael MD, [facility] 08/12/2016 2:14 PM

## 2016-08-12 NOTE — Patient Instructions (Signed)
Medication Instructions:  Your physician recommends that you continue on your current medications as directed. Please refer to the Current Medication list given to you today.   Labwork: Your physician recommends that you return for lab work today:Lipid/liver   Testing/Procedures: None ordered   Follow-Up: Your physician wants you to follow-up in: 12 months with Dr Johney FrameAllred Bonita QuinYou will receive a reminder letter in the mail two months in advance. If you don't receive a letter, please call our office to schedule the follow-up appointment.   Any Other Special Instructions Will Be Listed Below (If Applicable).     If you need a refill on your cardiac medications before your next appointment, please call your pharmacy.

## 2016-12-11 ENCOUNTER — Other Ambulatory Visit: Payer: Self-pay | Admitting: Internal Medicine

## 2017-02-02 ENCOUNTER — Other Ambulatory Visit: Payer: Self-pay | Admitting: Nurse Practitioner

## 2017-02-24 ENCOUNTER — Other Ambulatory Visit: Payer: Self-pay | Admitting: Nurse Practitioner

## 2017-06-08 ENCOUNTER — Other Ambulatory Visit: Payer: Self-pay | Admitting: Internal Medicine

## 2017-09-10 ENCOUNTER — Other Ambulatory Visit: Payer: Self-pay | Admitting: Internal Medicine

## 2017-09-10 ENCOUNTER — Other Ambulatory Visit: Payer: Self-pay | Admitting: Nurse Practitioner

## 2017-09-21 ENCOUNTER — Ambulatory Visit: Payer: Medicare HMO | Admitting: Internal Medicine

## 2017-09-21 ENCOUNTER — Encounter: Payer: Self-pay | Admitting: Internal Medicine

## 2017-09-21 VITALS — BP 136/78 | HR 61 | Ht 70.0 in | Wt 200.6 lb

## 2017-09-21 DIAGNOSIS — I480 Paroxysmal atrial fibrillation: Secondary | ICD-10-CM | POA: Diagnosis not present

## 2017-09-21 DIAGNOSIS — E781 Pure hyperglyceridemia: Secondary | ICD-10-CM | POA: Diagnosis not present

## 2017-09-21 DIAGNOSIS — I2510 Atherosclerotic heart disease of native coronary artery without angina pectoris: Secondary | ICD-10-CM | POA: Diagnosis not present

## 2017-09-21 MED ORDER — ATORVASTATIN CALCIUM 80 MG PO TABS: ORAL_TABLET | ORAL | 3 refills | Status: DC

## 2017-09-21 NOTE — Patient Instructions (Addendum)
Medication Instructions:  Your physician recommends that you continue on your current medications as directed. Please refer to the Current Medication list given to you today.  -- If you need a refill on your cardiac medications before your next appointment, please call your pharmacy. --  Labwork: TODAY:  CBC/BMET/LIPIDS   Testing/Procedures: None ordered  Follow-Up: Your physician wants you to follow-up in: 1 year with Dr. Johney FrameAllred.    You will receive a reminder letter in the mail two months in advance. If you don't receive a letter, please call our office to schedule the follow-up appointment.  Thank you for choosing [facility]!!   Sigurd SosMichael Steel Kerney, RN [phone]  Any Other Special Instructions Will Be Listed Below (If Applicable).

## 2017-09-21 NOTE — Progress Notes (Signed)
PCP: Johny BlamerHarris, William, MD   Primary EP: Dr Bosie HelperAllred  Ryman Delford FieldWright is a 76 y.o. male who presents today for routine electrophysiology followup.  Since last being seen in our clinic, the patient reports doing very well.  Today, he denies symptoms of palpitations, chest pain, shortness of breath,  lower extremity edema, dizziness, presyncope, or syncope.  The patient is otherwise without complaint today.  [place] and [place] of the year.  Past Medical History:  Diagnosis Date  . Atrial fibrillation (HCC)    once in setting of a spider bite 1/10, without recurrence  . Carotid artery occlusion    0-39% stenosis by US 4/11  . Coronary artery disease    a. s/p 6V CABG 11/18/08. b. acute inferior STEMI secondary to acute occlusion proximal RCA s/p successful PTCA/DES x 2 proximal and mid RCA, EF 50-55%  . Hyperglycemia   . Hyperlipidemia   . Hypertension   . Sinus bradycardia    a. Not on BB due to this. Previously did not tolerate BB.  . Sleep apnea    moderate by prior sleep study, symptoms resolved with weight loss   Past Surgical History:  Procedure Laterality Date  . CORONARY ARTERY BYPASS GRAFT  1/10   by Dr Tyrone SageGerhardt  . KNEE ARTHROSCOPY    . LEFT HEART CATHETERIZATION WITH CORONARY ANGIOGRAM N/A 01/23/2015   Procedure: LEFT HEART CATHETERIZATION WITH CORONARY ANGIOGRAM;  Surgeon: Kathleene Hazelhristopher D McAlhany, MD;  [facility];  Service: Cardiovascular;  Laterality: N/A;  . PERCUTANEOUS CORONARY STENT INTERVENTION (PCI-S)  01/23/2015   Procedure: PERCUTANEOUS CORONARY STENT INTERVENTION (PCI-S);  Surgeon: Kathleene Hazelhristopher D McAlhany, MD;  [facility];  Service: Cardiovascular;;  . TONSILLECTOMY      ROS- all systems are reviewed and negatives except as per HPI above  Current Outpatient Medications  Medication Sig Dispense Refill  . aspirin 81 MG tablet Take 1 tablet (81 mg total) by mouth daily. 30 tablet  11  . atorvastatin (LIPITOR) 80 MG tablet TAKE 1 TABLET (80 MG TOTAL) BY MOUTH EVERY EVENING. 90 tablet 3  . Cholecalciferol (VITAMIN D-3 PO) Take 2,000 Units by mouth daily.     . clopidogrel (PLAVIX) 75 MG tablet TAKE 1 TABLET (75 MG TOTAL) BY MOUTH DAILY. 90 tablet 3  . nitroGLYCERIN (NITROSTAT) 0.4 MG SL tablet PLACE 1 TABLET UNDER THE TONGUE EVERY 5 MINUTES AS NEEDED FOR CHEST PAIN (UP TO 3 DOSES) 10 tablet 0   No current facility-administered medications for this visit.     Physical Exam: Vitals:   09/21/17 1010  BP: 136/78  Pulse: 61  SpO2: 96%  Weight: 200 lb 9.6 oz (91 kg)  Height: 5\' 10"  (1.778 m)    GEN- The patient is well appearing, alert and oriented x 3 today.   Head- normocephalic, atraumatic Eyes-  Sclera clear, conjunctiva pink Ears- hearing intact Oropharynx- clear Lungs- Clear to ausculation bilaterally, normal work of breathing Heart- Regular rate and rhythm, no murmurs, rubs or gallops, PMI not laterally displaced GI- soft, NT, ND, + BS Extremities- no clubbing, cyanosis, or edema  EKG tracing ordered today is personally reviewed and shows sinus rhythm, nonspecific St/T changes  Assessment and Plan:  1. CAD S/p PCI of the RCA 2016  Doing well Continue on current medical therapy Has not tolerated beta blockers in the past Cbc today  2. HL Repeat fasting lipids, LFTs  3. paroxysmal  atrial fibrillation No symptomatic episodes since 2010 No changes at this time  Return to see me in a year  Hillis RangeJames Traye Bates MD, [facility] 09/21/2017 10:28 AM

## 2017-09-22 LAB — BASIC METABOLIC PANEL
BUN/Creatinine Ratio: 23 (ref 10–24)
BUN: 19 mg/dL (ref 8–27)
CO2: 22 mmol/L (ref 20–29)
Calcium: 9 mg/dL (ref 8.6–10.2)
Chloride: 105 mmol/L (ref 96–106)
Creatinine, Ser: 0.83 mg/dL (ref 0.76–1.27)
GFR, EST AFRICAN AMERICAN: 100 mL/min/{1.73_m2} (ref >59)
GFR, EST NON AFRICAN AMERICAN: 87 mL/min/{1.73_m2} (ref >59)
Glucose: 111 mg/dL — ABNORMAL HIGH (ref 65–99)
Potassium: 4.2 mmol/L (ref 3.5–5.2)
SODIUM: 140 mmol/L (ref 134–144)

## 2017-09-22 LAB — CBC WITH DIFFERENTIAL/PLATELET
BASOS ABS: 0 10*3/uL (ref 0.0–0.2)
Basos: 0 %
EOS (ABSOLUTE): 0.1 10*3/uL (ref 0.0–0.4)
Eos: 2 %
HEMATOCRIT: 44.3 % (ref 37.5–51.0)
Hemoglobin: 14.6 g/dL (ref 13.0–17.7)
Immature Grans (Abs): 0 10*3/uL (ref 0.0–0.1)
Immature Granulocytes: 0 %
LYMPHS ABS: 1.5 10*3/uL (ref 0.7–3.1)
Lymphs: 26 %
MCH: 29.9 pg (ref 26.6–33.0)
MCHC: 33 g/dL (ref 31.5–35.7)
MCV: 91 fL (ref 79–97)
MONOCYTES: 7 %
Monocytes Absolute: 0.4 10*3/uL (ref 0.1–0.9)
NEUTROS ABS: 3.7 10*3/uL (ref 1.4–7.0)
Neutrophils: 65 %
Platelets: 199 10*3/uL (ref 150–379)
RBC: 4.89 x10E6/uL (ref 4.14–5.80)
RDW: 13.8 % (ref 12.3–15.4)
WBC: 5.8 10*3/uL (ref 3.4–10.8)

## 2017-09-22 LAB — LIPID PANEL
Chol/HDL Ratio: 2.6 ratio (ref 0.0–5.0)
Cholesterol, Total: 131 mg/dL (ref 100–199)
HDL: 50 mg/dL (ref >39)
LDL Calculated: 70 mg/dL (ref 0–99)
Triglycerides: 56 mg/dL (ref 0–149)
VLDL CHOLESTEROL CAL: 11 mg/dL (ref 5–40)

## 2017-10-03 ENCOUNTER — Other Ambulatory Visit: Payer: Self-pay | Admitting: Internal Medicine

## 2018-01-27 ENCOUNTER — Other Ambulatory Visit: Payer: Self-pay | Admitting: Nurse Practitioner

## 2018-06-25 ENCOUNTER — Other Ambulatory Visit: Payer: Self-pay | Admitting: Nurse Practitioner

## 2018-10-05 ENCOUNTER — Other Ambulatory Visit: Payer: Self-pay | Admitting: Internal Medicine

## 2018-10-05 MED ORDER — ATORVASTATIN CALCIUM 80 MG PO TABS: 80.0000 mg | ORAL_TABLET | Freq: Every day | ORAL | 3 refills | Status: DC

## 2018-10-16 ENCOUNTER — Other Ambulatory Visit: Payer: Self-pay | Admitting: Nurse Practitioner

## 2018-11-08 ENCOUNTER — Other Ambulatory Visit: Payer: Self-pay | Admitting: Nurse Practitioner

## 2018-11-24 ENCOUNTER — Ambulatory Visit: Payer: Medicare HMO | Admitting: Internal Medicine

## 2018-11-28 ENCOUNTER — Encounter: Payer: Self-pay | Admitting: Internal Medicine

## 2018-11-29 ENCOUNTER — Encounter: Payer: Self-pay | Admitting: Internal Medicine

## 2018-11-29 ENCOUNTER — Encounter (INDEPENDENT_AMBULATORY_CARE_PROVIDER_SITE_OTHER): Payer: Self-pay

## 2018-11-29 ENCOUNTER — Ambulatory Visit: Payer: Medicare HMO | Admitting: Internal Medicine

## 2018-11-29 VITALS — BP 132/70 | HR 70 | Ht 70.0 in | Wt 201.4 lb

## 2018-11-29 DIAGNOSIS — I2510 Atherosclerotic heart disease of native coronary artery without angina pectoris: Secondary | ICD-10-CM | POA: Diagnosis not present

## 2018-11-29 DIAGNOSIS — I480 Paroxysmal atrial fibrillation: Secondary | ICD-10-CM | POA: Diagnosis not present

## 2018-11-29 DIAGNOSIS — E781 Pure hyperglyceridemia: Secondary | ICD-10-CM

## 2018-11-29 NOTE — Progress Notes (Signed)
PCP: [name], MD   Primary EP: Dr Bosie Helper Wadding is a 78 y.o. male who presents today for routine electrophysiology followup.  Since last being seen in our clinic, the patient reports doing very well.  He is living with his wife (recently married) in [place].  Works as an Biomedical scientist. Today, he denies symptoms of palpitations, chest pain, shortness of breath,  lower extremity edema, dizziness, presyncope, or syncope.  The patient is otherwise without complaint today.   Past Medical History:  Diagnosis Date  . Atrial fibrillation (HCC)    once in setting of a spider bite 1/10, without recurrence  . Carotid artery occlusion    0-39% stenosis by [place] 4/11  . Coronary artery disease    a. s/p 6V CABG 11/18/08. b. acute inferior STEMI secondary to acute occlusion proximal RCA s/p successful PTCA/DES x 2 proximal and mid RCA, EF 50-55%  . Hyperglycemia   . Hyperlipidemia   . Hypertension   . Sinus bradycardia    a. Not on BB due to this. Previously did not tolerate BB.  . Sleep apnea    moderate by prior sleep study, symptoms resolved with weight loss   Past Surgical History:  Procedure Laterality Date  . CORONARY ARTERY BYPASS GRAFT  1/10   by Dr Tyrone Sage  . KNEE ARTHROSCOPY    . LEFT HEART CATHETERIZATION WITH CORONARY ANGIOGRAM N/A 01/23/2015   Procedure: LEFT HEART CATHETERIZATION WITH CORONARY ANGIOGRAM;  Surgeon: Kathleene Hazel, MD;  Location[facility] C[facility] LAB;  Service: Cardiovascular;  Laterality: N/A;  . PERCUTANEOUS CORONARY STENT INTERVENTION (PCI-S)  01/23/2015   Procedure: PERCUTANEOUS CORONARY STENT INTERVENTION (PCI-S);  Surgeon: Kathleene Hazel, MD;  [facility] LAB;  Service: Cardiovascular;;  . TONSILLECTOMY      ROS- all systems are reviewed and negatives except as per HPI above  Current Outpatient Medications  Medication Sig Dispense Refill  . aspirin 81 MG tablet Take 1 tablet (81 mg total) by mouth daily. 30 tablet 11  .  atorvastatin (LIPITOR) 80 MG tablet Take 1 tablet (80 mg total) by mouth daily at 6 PM. Please make overdue appt with Dr. [name] before anymore refills. 1st attempt 90 tablet 3  . Cholecalciferol (VITAMIN D-3 PO) Take 2,000 Units by mouth daily.     . clopidogrel (PLAVIX) 75 MG tablet Take 1 tablet (75 mg total) by mouth daily. Please make overdue appt with Dr. [name] before anymore refills. 2nd attempt 15 tablet 0  . nitroGLYCERIN (NITROSTAT) 0.4 MG SL tablet Place 1 tablet (0.4 mg total) under the tongue every 5 (five) minutes as needed for chest pain. Please make appt for November. 1st attempt 25 tablet 4   No current facility-administered medications for this visit.     Physical Exam: Vitals:   11/29/18 1448  BP: 132/70  Pulse: 70  SpO2: 99%  Weight: 201 lb 6.4 oz (91.4 kg)  Height: 5\' 10"  (1.778 m)    GEN- The patient is well appearing, alert and oriented x 3 today.   Head- normocephalic, atraumatic Eyes-  Sclera clear, conjunctiva pink Ears- hearing intact Oropharynx- clear Lungs- Clear to ausculation bilaterally, normal work of breathing Heart- Regular rate and rhythm, no murmurs, rubs or gallops, PMI not laterally displaced GI- soft, NT, ND, + BS Extremities- no clubbing, cyanosis, or edema  Wt Readings from Last 3 Encounters:  11/29/18 201 lb 6.4 oz (91.4 kg)  09/21/17 200 lb 9.6 oz (91 kg)  08/12/16 199  lb (90.3 kg)    EKG tracing ordered today is personally reviewed and shows sinus rhythm  Assessment and Plan:  1. CAD S/p PCI of the RCA in 2016 Doing well Has not tolerated beta blockers in the past  2. HL We will check fasting lipids and LFTs (he is not fasting today)  3. Paroxysmal atrial fibrillation No symptomatic AF since 2010  Return in a year  Hillis RangeJames Lilliam Chamblee MD, [facility]ACC 11/29/2018 2:55 PM

## 2018-11-29 NOTE — Patient Instructions (Addendum)
Medication Instructions:  Your physician recommends that you continue on your current medications as directed. Please refer to the Current Medication list given to you today.  Labwork: None ordered.  Testing/Procedures: None ordered.  Follow-Up: Your physician wants you to follow-up in: one year with Dr. [name].   You will receive a reminder letter in the mail two months in advance. If you don't receive a letter, please call our office to schedule the follow-up appointment.  Any Other Special Instructions Will Be Listed Below (If Applicable).  If you need a refill on your cardiac medications before your next appointment, please call your pharmacy.    Please fax lab results to [phone] attention Boneta Lucks RN for Dr. [name]

## 2018-12-02 ENCOUNTER — Other Ambulatory Visit: Payer: Self-pay | Admitting: Nurse Practitioner

## 2019-10-09 ENCOUNTER — Other Ambulatory Visit: Payer: Self-pay

## 2019-10-09 MED ORDER — ATORVASTATIN CALCIUM 80 MG PO TABS: 80.0000 mg | ORAL_TABLET | Freq: Every day | ORAL | 0 refills | Status: DC

## 2019-11-26 ENCOUNTER — Encounter: Payer: Self-pay | Admitting: Internal Medicine

## 2019-11-26 ENCOUNTER — Other Ambulatory Visit: Payer: Self-pay

## 2019-11-26 ENCOUNTER — Telehealth (INDEPENDENT_AMBULATORY_CARE_PROVIDER_SITE_OTHER): Payer: Medicare PPO | Admitting: Internal Medicine

## 2019-11-26 VITALS — BP 141/75 | HR 65 | Ht 70.0 in | Wt 202.0 lb

## 2019-11-26 DIAGNOSIS — I2510 Atherosclerotic heart disease of native coronary artery without angina pectoris: Secondary | ICD-10-CM | POA: Diagnosis not present

## 2019-11-26 DIAGNOSIS — I480 Paroxysmal atrial fibrillation: Secondary | ICD-10-CM

## 2019-11-26 DIAGNOSIS — E781 Pure hyperglyceridemia: Secondary | ICD-10-CM

## 2019-11-26 NOTE — Progress Notes (Signed)
Electrophysiology TeleHealth Note  Due to national recommendations of social distancing due to COVID 19, an audio telehealth visit is felt to be most appropriate for this patient at this time.  Verbal consent was obtained by me for the telehealth visit today.  The patient does not have capability for a virtual visit.  A phone visit is therefore required today.   Date:  11/26/2019   ID:  Charles House, DOB 03-11-1941, MRN [id number]  Location: patient's home  Provider location:  [place]  Evaluation Performed: Follow-up visit  PCP:  [name], MD   Electrophysiologist:  Dr [name]  Chief Complaint:  palpitations  History of Present Illness:    Charles House is a 79 y.o. male who presents via telehealth conferencing today.  Since last being seen in our clinic, the patient reports doing very well.  He is living with his wife (recently married) in [place].  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  Today, he denies symptoms of palpitations, chest pain, shortness of breath,  lower extremity edema, dizziness, presyncope, or syncope.  The patient is otherwise without complaint today.  The patient denies symptoms of fevers, chills, cough, or new SOB worrisome for COVID 19.  Past Medical History:  Diagnosis Date  . Atrial fibrillation (HCC)    once in setting of a spider bite 1/10, without recurrence  . Carotid artery occlusion    0-39% stenosis by [place] 4/11  . Coronary artery disease    a. s/p 6V CABG 11/18/08. b. acute inferior STEMI secondary to acute occlusion proximal RCA s/p successful PTCA/DES x 2 proximal and mid RCA, EF 50-55%  . Hyperglycemia   . Hyperlipidemia   . Hypertension   . Sinus bradycardia    a. Not on BB due to this. Previously did not tolerate BB.  . Sleep apnea    moderate by prior sleep study, symptoms resolved with weight loss    Past Surgical History:  Procedure Laterality Date  . CORONARY ARTERY BYPASS GRAFT  1/10   by Dr Tyrone Sage  . KNEE ARTHROSCOPY    . LEFT HEART CATHETERIZATION WITH CORONARY ANGIOGRAM N/A 01/23/2015   Procedure: LEFT HEART CATHETERIZATION WITH CORONARY ANGIOGRAM;  Surgeon: Kathleene Hazel, MD;  [facility] LAB;  Service: Cardiovascular;  Laterality: N/A;  . PERCUTANEOUS CORONARY STENT INTERVENTION (PCI-S)  01/23/2015   Procedure: PERCUTANEOUS CORONARY STENT INTERVENTION (PCI-S);  Surgeon: Kathleene Hazel, MD;  Location: [facility] CATH LAB;  Service: Cardiovascular;;  . TONSILLECTOMY      Current Outpatient Medications  Medication Sig Dispense Refill  . aspirin 81 MG tablet Take 1 tablet (81 mg total) by mouth daily. 30 tablet 11  . atorvastatin (LIPITOR) 80 MG tablet Take 1 tablet (80 mg total) by mouth daily at 6 PM. 90 tablet 0  . Cholecalciferol (VITAMIN D-3 PO) Take 2,000 Units by mouth daily.     . clopidogrel (PLAVIX) 75 MG tablet Take 1 tablet (75 mg total) by mouth daily. 90 tablet 3  . nitroGLYCERIN (NITROSTAT) 0.4 MG SL tablet Place 1 tablet (0.4 mg total) under the tongue every 5 (five) minutes as needed for chest pain. Please make appt for November. 1st attempt 25 tablet 4   No current facility-administered medications for this visit.    Allergies:   Patient has no known allergies.   Social History:  The patient  reports that he has never smoked. He has never used smokeless tobacco. He  reports that he does not drink alcohol or use drugs.   Family History:  The patient's family history includes Emphysema in his father; Stroke in his mother.   ROS:  Please see the history of present illness.   All other systems are personally reviewed and negative.    Exam:    Vital Signs:  BP (!) 141/75   Pulse 65   Ht 5\' 10"  (1.778 m)   Wt 202 lb (91.6 kg)   BMI 28.98 kg/m   Well sounding, alert and conversant   Labs/Other Tests and Data Reviewed:    Recent Labs: No results found for requested labs within last 8760 hours.   Wt Readings from Last 3  Encounters:  11/26/19 202 lb (91.6 kg)  11/29/18 201 lb 6.4 oz (91.4 kg)  09/21/17 200 lb 9.6 oz (91 kg)     ASSESSMENT & PLAN:    1.  CAD Doing well s/p PCI of the RCA in 2016 No ischemic symptoms Continue on ASA and plavix He has not tolerated beta blockers in the past  2. Paroxysmal atrial fibrllation Asymptomatic since 2010  3. HL He says that he has labs done at the [place] I have asked that he send them to me for review  Follow-up:  12 months with me in a year   Patient Risk:  after full review of this patients clinical status, I feel that they are at moderate risk at this time.  Today, I have spent 15 minutes with the patient with telehealth technology discussing arrhythmia management .    Army Fossa, MD  11/26/2019 12:03 PM     [place] [address] [phone] (office) [phone] (fax)

## 2019-12-23 ENCOUNTER — Other Ambulatory Visit: Payer: Self-pay | Admitting: Nurse Practitioner

## 2019-12-28 ENCOUNTER — Other Ambulatory Visit: Payer: Self-pay | Admitting: Internal Medicine

## 2020-01-22 ENCOUNTER — Other Ambulatory Visit: Payer: Self-pay | Admitting: Internal Medicine

## 2020-01-22 MED ORDER — ATORVASTATIN CALCIUM 80 MG PO TABS: 80.0000 mg | ORAL_TABLET | Freq: Every day | ORAL | 2 refills | Status: DC

## 2020-01-22 MED ORDER — CLOPIDOGREL BISULFATE 75 MG PO TABS: 75.0000 mg | ORAL_TABLET | Freq: Every day | ORAL | 2 refills | Status: DC

## 2020-03-25 ENCOUNTER — Other Ambulatory Visit: Payer: Self-pay | Admitting: Internal Medicine

## 2020-07-10 ENCOUNTER — Encounter (HOSPITAL_COMMUNITY): Payer: Self-pay | Admitting: Emergency Medicine

## 2020-07-10 ENCOUNTER — Other Ambulatory Visit: Payer: Self-pay

## 2020-07-10 ENCOUNTER — Emergency Department (HOSPITAL_COMMUNITY)
Admission: EM | Admit: 2020-07-10 | Discharge: 2020-07-10 | Disposition: A | Discharge status: Home / Self Care | Payer: Medicare PPO | Attending: Emergency Medicine | Admitting: Emergency Medicine

## 2020-07-10 DIAGNOSIS — R202 Paresthesia of skin: Secondary | ICD-10-CM | POA: Insufficient documentation

## 2020-07-10 DIAGNOSIS — Z5321 Procedure and treatment not carried out due to patient leaving prior to being seen by health care provider: Secondary | ICD-10-CM | POA: Diagnosis not present

## 2020-07-10 LAB — BASIC METABOLIC PANEL
Anion gap: 9 (ref 5–15)
BUN: 19 mg/dL (ref 8–23)
CO2: 22 mmol/L (ref 22–32)
Calcium: 9.1 mg/dL (ref 8.9–10.3)
Chloride: 109 mmol/L (ref 98–111)
Creatinine, Ser: 0.98 mg/dL (ref 0.61–1.24)
GFR calc Af Amer: >60 mL/min (ref >60)
GFR calc non Af Amer: >60 mL/min (ref >60)
Glucose, Bld: 118 mg/dL — ABNORMAL HIGH (ref 70–99)
Potassium: 4.1 mmol/L (ref 3.5–5.1)
Sodium: 140 mmol/L (ref 135–145)

## 2020-07-10 LAB — CBC
HCT: 44.6 % (ref 39.0–52.0)
Hemoglobin: 14.4 g/dL (ref 13.0–17.0)
MCH: 29.6 pg (ref 26.0–34.0)
MCHC: 32.3 g/dL (ref 30.0–36.0)
MCV: 91.8 fL (ref 80.0–100.0)
Platelets: 192 10*3/uL (ref 150–400)
RBC: 4.86 MIL/uL (ref 4.22–5.81)
RDW: 13.1 % (ref 11.5–15.5)
WBC: 6 10*3/uL (ref 4.0–10.5)
nRBC: 0 % (ref 0.0–0.2)

## 2020-07-10 LAB — MAGNESIUM: Magnesium: 2 mg/dL (ref 1.7–2.4)

## 2020-07-10 LAB — PHOSPHORUS: Phosphorus: 3 mg/dL (ref 2.5–4.6)

## 2020-07-10 NOTE — ED Triage Notes (Signed)
C/o intermittent tingling to bilateral fingers x 2 weeks.  Denies chest pain, back pain, or any other symptoms.  No neuro deficits noted on triage exam.

## 2020-07-10 NOTE — ED Notes (Signed)
Message sent to Fayrene Helper, PA regarding pt.  Orders received for labs.

## 2020-07-10 NOTE — ED Notes (Signed)
Pt stated he was leaving. This NT verbalized to pt that we wanted him to stay and get seen by EDP. Pt stated he was leaving.

## 2020-07-11 ENCOUNTER — Telehealth: Payer: Self-pay | Admitting: Internal Medicine

## 2020-07-11 NOTE — Telephone Encounter (Signed)
New message    Patient walked in to schedule an appt with Dr [name].  He states that he went to the ER yesterday to be seen because his fingers were tingling.  The wait was 16hrs so he left.  Appointment was made with Dr [name] for oct 20th.  He wanted Dr [name] only.  I told patient I would send a message to the nurse just to make sure he is ok to wait until oct since he thought it was bad enough to go to the ER.  Please call.

## 2020-07-11 NOTE — Telephone Encounter (Signed)
The tingling is bilaterally in both hands and is not constant tingling, nothing causing it to come and go that he can think of. Labs and EKG look okay from yesterday in the ER. Pt only went to ER because he came to office to schedule and per patient front desk told him to get checked out.   Advised patient to keep appointment with Dr. [name] and see if PCP Tiburcio Pea could see him sooner as well. Educated that if symptoms change at all the contact the off with changes. Patient verbalized understanding.

## 2020-08-20 ENCOUNTER — Encounter: Payer: Self-pay | Admitting: Internal Medicine

## 2020-08-20 ENCOUNTER — Other Ambulatory Visit: Payer: Self-pay

## 2020-08-20 ENCOUNTER — Ambulatory Visit: Payer: Medicare HMO | Admitting: Internal Medicine

## 2020-08-20 VITALS — BP 132/76 | HR 79 | Ht 70.0 in | Wt 201.0 lb

## 2020-08-20 DIAGNOSIS — E781 Pure hyperglyceridemia: Secondary | ICD-10-CM | POA: Diagnosis not present

## 2020-08-20 DIAGNOSIS — I2510 Atherosclerotic heart disease of native coronary artery without angina pectoris: Secondary | ICD-10-CM

## 2020-08-20 DIAGNOSIS — I480 Paroxysmal atrial fibrillation: Secondary | ICD-10-CM | POA: Diagnosis not present

## 2020-08-20 NOTE — Patient Instructions (Signed)
Medication Instructions:  Your physician recommends that you continue on your current medications as directed. Please refer to the Current Medication list given to you today.  *If you need a refill on your cardiac medications before your next appointment, please call your pharmacy*  Lab Work: None ordered.  If you have labs (blood work) drawn today and your tests are completely normal, you will receive your results only by: . MyChart Message (if you have MyChart) OR . A paper copy in the mail If you have any lab test that is abnormal or we need to change your treatment, we will call you to review the results.  Testing/Procedures: None ordered.  Follow-Up: At [facility], you and your health needs are our priority.  As part of our continuing mission to provide you with exceptional heart care, we have created designated Provider Care Teams.  These Care Teams include your primary Cardiologist (physician) and Advanced Practice Providers (APPs -  Physician Assistants and Nurse Practitioners) who all work together to provide you with the care you need, when you need it.  We recommend signing up for the patient portal called "MyChart".  Sign up information is provided on this After Visit Summary.  MyChart is used to connect with patients for Virtual Visits (Telemedicine).  Patients are able to view lab/test results, encounter notes, upcoming appointments, etc.  Non-urgent messages can be sent to your provider as well.   To learn more about what you can do with MyChart, go to [email or web address].    Your next appointment:   Your physician wants you to follow-up in: 1 year with Dr. Allred. You will receive a reminder letter in the mail two months in advance. If you don't receive a letter, please call our office to schedule the follow-up appointment.   Other Instructions:  

## 2020-08-20 NOTE — Progress Notes (Signed)
PCP: [name], MD   Primary EP: Dr Bosie Helper Charles House is a 79 y.o. male who presents today for routine electrophysiology followup.  Since last being seen in our clinic, the patient reports doing very well.  Today, he denies symptoms of palpitations, chest pain, shortness of breath,  lower extremity edema, dizziness, presyncope, or syncope.  He has had rare tingling in the tips of fingers in both hands for which he went to the ED 07/10/2020 (note reviewed).   This has resolved.  The patient is otherwise without complaint today.   Past Medical History:  Diagnosis Date   Atrial fibrillation (HCC)    once in setting of a spider bite 1/10, without recurrence   Carotid artery occlusion    0-39% stenosis by [place] 4/11   Coronary artery disease    a. s/p 6V CABG 11/18/08. b. acute inferior STEMI secondary to acute occlusion proximal RCA s/p successful PTCA/DES x 2 proximal and mid RCA, EF 50-55%   Hyperglycemia    Hyperlipidemia    Hypertension    Sinus bradycardia    a. Not on BB due to this. Previously did not tolerate BB.   Sleep apnea    moderate by prior sleep study, symptoms resolved with weight loss   Past Surgical History:  Procedure Laterality Date   CORONARY ARTERY BYPASS GRAFT  1/10   by Dr Tyrone Sage   KNEE ARTHROSCOPY     LEFT HEART CATHETERIZATION WITH CORONARY ANGIOGRAM N/A 01/23/2015   Procedure: LEFT HEART CATHETERIZATION WITH CORONARY ANGIOGRAM;  Surgeon: Kathleene Hazel, MD;  Location: [facility] CATH LAB;  Service: Cardiovascular;  Laterality: N/A;   PERCUTANEOUS CORONARY STENT INTERVENTION (PCI-S)  01/23/2015   Procedure: PERCUTANEOUS CORONARY STENT INTERVENTION (PCI-S);  Surgeon: Kathleene Hazel, MD;  Location: [facility] CATH LAB;  Service: Cardiovascular;;   TONSILLECTOMY      ROS- all systems are reviewed and negatives except as per HPI above  Current Outpatient Medications  Medication Sig Dispense Refill   aspirin 81 MG tablet Take 1 tablet (81  mg total) by mouth daily. 30 tablet 11   atorvastatin (LIPITOR) 80 MG tablet TAKE 1 TABLET (80 MG TOTAL) BY MOUTH DAILY AT 6 PM. 90 tablet 2   Cholecalciferol (VITAMIN D-3 PO) Take 2,000 Units by mouth daily.      clopidogrel (PLAVIX) 75 MG tablet Take 1 tablet (75 mg total) by mouth daily. 90 tablet 2   nitroGLYCERIN (NITROSTAT) 0.4 MG SL tablet Place 1 tablet (0.4 mg total) under the tongue every 5 (five) minutes as needed for chest pain. Please make appt for November. 1st attempt 25 tablet 4   No current facility-administered medications for this visit.    Physical Exam: Vitals:   08/20/20 1554  BP: 132/76  Pulse: 79  SpO2: 94%  Weight: 201 lb (91.2 kg)  Height: 5\' 10"  (1.778 m)    GEN- The patient is well appearing, alert and oriented x 3 today.   Head- normocephalic, atraumatic Eyes-  Sclera clear, conjunctiva pink Ears- hearing intact Oropharynx- clear Lungs- Clear to ausculation bilaterally, normal work of breathing Heart- Regular rate and rhythm, no murmurs, rubs or gallops, PMI not laterally displaced GI- soft, NT, ND, + BS Extremities- no clubbing, cyanosis, or edema  Wt Readings from Last 3 Encounters:  08/20/20 201 lb (91.2 kg)  07/10/20 200 lb (90.7 kg)  11/26/19 202 lb (91.6 kg)    EKG tracing ordered today is personally reviewed and shows sinus rhythm  Assessment  and Plan:  1. CAD S/p PCI of RCA 2016 Doing well, without ischemic symptoms Has not tolerated beta blocker therapy  2. HL On statin Labs followed at [facility]  3. Paroxysmal atrial fibrillation Asymptomatic since 2010  Risks, benefits and potential toxicities for medications prescribed and/or refilled reviewed with patient today.   Return in a year  [name] MD, [facility] 08/20/2020 4:17 PM

## 2020-09-19 ENCOUNTER — Other Ambulatory Visit: Payer: Self-pay | Admitting: Internal Medicine

## 2020-12-23 ENCOUNTER — Telehealth: Payer: Self-pay | Admitting: *Deleted

## 2020-12-23 DIAGNOSIS — M25562 Pain in left knee: Secondary | ICD-10-CM | POA: Diagnosis not present

## 2020-12-23 NOTE — Telephone Encounter (Signed)
   Primary Cardiologist: Dr. [name]  Chart reviewed as part of pre-operative protocol coverage. Mr.  Charles House has a history of atrial fibrillation, CAD s/p CABG 2010 and subsequent PTCA/DESx2 to prox and mid RCA in 2016. He has been maintained on DAPT with Aspirin and Plavix. He was last seen by Dr. [name] 08/20/20 and doing well from a cardiovascular perspective. No change were made at that time. He was recommended to follow up in 1 year.   Will route to his cardiologist for input on holding Plavix/Aspirin.  Once recommendations received, will contact patient regarding clearance.   Alver Sorrow, NP 12/23/2020, 2:54 PM

## 2020-12-23 NOTE — Telephone Encounter (Signed)
   [facility] Pre-operative Risk Assessment    HEARTCARE STAFF: - Please ensure there is not already an duplicate clearance open for this procedure. - Under Visit Info/Reason for Call, type in Other and utilize the format Clearance MM/DD/YY or Clearance TBD. Do not use dashes or single digits. - If request is for dental extraction, please clarify the # of teeth to be extracted.  Request for surgical clearance: SURGEON IS ASKING IF OK TO DO IN OUT PT SETTING OR DOES THIS NEED TO BE DONE IN A HOSPITAL SETTING    1. What type of surgery is being performed? LEFT TOTAL KNEE REPLACEMENT    2. When is this surgery scheduled? TBD   3. What type of clearance is required (medical clearance vs. Pharmacy clearance to hold med vs. Both)? MEDICAL  4. Are there any medications that need to be held prior to surgery and how long? PLAVIX AND ASA   5. Practice name and name of physician performing surgery? [facility]; DR. Elsie Saas  6. What is the office phone number? [phone]   7.   What is the office fax number? [place].   Anesthesia type (None, local, MAC, general) ? SPINAL WITH BLOCK ANESTHESIA     Charles House 12/23/2020, 2:03 PM  _________________________________________________________________   (provider comments below)

## 2020-12-26 NOTE — Telephone Encounter (Signed)
OK to hold ASA and plavix 7 days prior to surgery if medically necessary and resume as soon as possibly post op.

## 2020-12-29 NOTE — Telephone Encounter (Signed)
Left message for pt to call back. Tereso Newcomer, PA-C    12/29/2020 8:27 AM

## 2020-12-30 NOTE — Telephone Encounter (Signed)
   Primary Cardiologist: [name], MD   Chart reviewed as part of pre-operative protocol coverage.   Hx: CAD, s/p CABG in 2010, s/p inferior STEMI 12/2014, s/p DES x2 to the RCA; HTN, HLD, remote paroxysmal A. fib, OSA.  Last seen by Dr. [name]: 08/2020  RCRI:  Perioperative Risk of Major Cardiac Event is (%): 0.9 (low risk) DASI:  Functional Capacity in METs is: 4.31 (functional status is fair )  Patient was contacted 12/30/2020 in reference to pre-operative risk assessment for pending surgery as outlined below.    Since last seen, Charles House has done well without chest pain or shortness of breath.  Recommendations:  Therefore, based on ACC/AHA guidelines, the patient is at acceptable risk for the planned procedure without further cardiovascular testing.   Clopidogrel (Plavix) can be held for 7 days prior to surgery and resumed postoperatively as soon as it is felt to be safe.  Ideally, the patient should remain on aspirin throughout the perioperative period.  However, if the bleeding risk is too great, he may hold aspirin for 7 days prior to surgery and resume postoperatively when felt to be safe.   Please call with questions. Tereso Newcomer, PA-C 12/30/2020, 9:54 AM

## 2020-12-30 NOTE — Telephone Encounter (Signed)
Notes faxed to surgeon. This phone note will be removed from the preop pool. Tereso Newcomer, PA-C  12/30/2020 9:59 AM

## 2021-02-24 DIAGNOSIS — M25562 Pain in left knee: Secondary | ICD-10-CM | POA: Diagnosis not present

## 2021-02-26 ENCOUNTER — Encounter: Payer: Self-pay | Admitting: Orthopedic Surgery

## 2021-02-26 DIAGNOSIS — M1712 Unilateral primary osteoarthritis, left knee: Secondary | ICD-10-CM | POA: Diagnosis present

## 2021-02-26 NOTE — H&P (Signed)
TOTAL KNEE ADMISSION H&P  Patient is being admitted for left total knee arthroplasty.  Subjective:  Chief Complaint:left knee pain.  HPI: Charles House, 80 y.o. male, has a history of pain and functional disability in the left knee due to arthritis and has failed non-surgical conservative treatments for greater than 12 weeks to includeNSAID's and/or analgesics, corticosteriod injections, viscosupplementation injections, flexibility and strengthening excercises, supervised PT with diminished ADL's post treatment, use of assistive devices and activity modification.  Onset of symptoms was gradual, starting 10 years ago with gradually worsening course since that time. The patient noted no past surgery on the left knee(s).  Patient currently rates pain in the left knee(s) at 10 out of 10 with activity. Patient has night pain, worsening of pain with activity and weight bearing, pain that interferes with activities of daily living, crepitus and joint swelling.  Patient has evidence of subchondral sclerosis, periarticular osteophytes and joint space narrowing by imaging studies. There is no active infection.  Patient Active Problem List   Diagnosis Date Noted  . Primary localized osteoarthritis of left knee   . S/P CABG x 6 01/24/2015  . ST elevation myocardial infarction involving right coronary artery (HCC)   . Hyperglycemia   . Sinus bradycardia   . Coronary artery disease   . ST elevation myocardial infarction (STEMI) of inferior wall (HCC) 01/23/2015  . Hyperlipidemia 01/22/2009  . Essential hypertension 01/22/2009  . MYOCARDIAL INFARCTION, HX OF 01/22/2009  . Coronary atherosclerosis 01/22/2009  . PAROXYSMAL ATRIAL FIBRILLATION 01/22/2009  . TONSILLECTOMY, HX OF 01/22/2009  . CORONARY ARTERY BYPASS GRAFT, HX OF 01/22/2009  . ARTHROSCOPY, RIGHT KNEE, HX OF 01/22/2009   Past Medical History:  Diagnosis Date  . Atrial fibrillation (HCC)    once in setting of a spider bite 1/10, without  recurrence  . Carotid artery occlusion    0-39% stenosis by [place] 4/11  . Coronary artery disease    a. s/p 6V CABG 11/18/08. b. acute inferior STEMI secondary to acute occlusion proximal RCA s/p successful PTCA/DES x 2 proximal and mid RCA, EF 50-55%  . Hyperglycemia   . Hyperlipidemia   . Hypertension   . Primary localized osteoarthritis of left knee   . Sinus bradycardia    a. Not on BB due to this. Previously did not tolerate BB.  . Sleep apnea    moderate by prior sleep study, symptoms resolved with weight loss    Past Surgical History:  Procedure Laterality Date  . CORONARY ARTERY BYPASS GRAFT  1/10   by Dr Tyrone Sage  . KNEE ARTHROSCOPY    . LEFT HEART CATHETERIZATION WITH CORONARY ANGIOGRAM N/A 01/23/2015   Procedure: LEFT HEART CATHETERIZATION WITH CORONARY ANGIOGRAM;  Surgeon: Kathleene Hazel, MD;  Location: [facility] CATH LAB;  Service: Cardiovascular;  Laterality: N/A;  . PERCUTANEOUS CORONARY STENT INTERVENTION (PCI-S)  01/23/2015   Procedure: PERCUTANEOUS CORONARY STENT INTERVENTION (PCI-S);  Surgeon: Kathleene Hazel, MD;  Location: [facility] CATH LAB;  Service: Cardiovascular;;  . TONSILLECTOMY      No current facility-administered medications for this encounter.   Current Outpatient Medications  Medication Sig Dispense Refill Last Dose  . acetaminophen (TYLENOL) 500 MG tablet Take 1,000 mg by mouth every 8 (eight) hours as needed for moderate pain.     Marland Kitchen aspirin 81 MG tablet Take 1 tablet (81 mg total) by mouth daily. 30 tablet 11   . atorvastatin (LIPITOR) 80 MG tablet TAKE 1 TABLET (80 MG TOTAL) BY MOUTH DAILY AT 6 PM. 90  tablet 3   . Cholecalciferol (VITAMIN D-3) 25 MCG (1000 UT) CAPS Take 2,000 Units by mouth daily.     . clopidogrel (PLAVIX) 75 MG tablet TAKE 1 TABLET EVERY DAY (Patient taking differently: Take 75 mg by mouth daily.) 90 tablet 3   . diclofenac Sodium (VOLTAREN) 1 % GEL Apply 1 application topically daily.     . nitroGLYCERIN (NITROSTAT) 0.4 MG SL  tablet Place 1 tablet (0.4 mg total) under the tongue every 5 (five) minutes as needed for chest pain. Please make appt for November. 1st attempt 25 tablet 4    No Known Allergies  Social History   Tobacco Use  . Smoking status: Never Smoker  . Smokeless tobacco: Never Used  Substance Use Topics  . Alcohol use: No    Family History  Problem Relation Age of Onset  . Stroke Mother   . Emphysema Father      Review of Systems  Constitutional: Negative.   HENT: Negative.   Eyes: Negative.   Respiratory: Negative.   Cardiovascular: Negative.   Gastrointestinal: Negative.   Endocrine: Negative.   Genitourinary: Negative.   Musculoskeletal: Positive for arthralgias, back pain, gait problem, joint swelling and myalgias.  Skin: Negative.   Allergic/Immunologic: Negative.   Hematological: Negative.   Psychiatric/Behavioral: Negative.     Objective:  Physical Exam Constitutional:      Appearance: Normal appearance.  HENT:     Head: Atraumatic.     Right Ear: External ear normal.     Left Ear: External ear normal.     Nose: Nose normal.     Mouth/Throat:     Mouth: Mucous membranes are moist.  Eyes:     Extraocular Movements: Extraocular movements intact.  Cardiovascular:     Rate and Rhythm: Normal rate.     Pulses: Normal pulses.  Pulmonary:     Effort: Pulmonary effort is normal.  Abdominal:     Palpations: Abdomen is soft.  Genitourinary:    Comments: Not pertinent to current symptomatology therefore not examined. Musculoskeletal:     Cervical back: Neck supple.     Comments: Examination of both knees reveals pain bilaterally, left worse than right.  Significant varus deformity.  Range of motion -5 to 120 degrees.  Knees are stable.  Vascular exam: Pulses are 2+ and symmetric.  Neurologic exam: Distal motor and sensory examination is within normal limits.    Skin:    General: Skin is warm and dry.     Capillary Refill: Capillary refill takes less than 2 seconds.   Neurological:     General: No focal deficit present.     Mental Status: He is alert.  Psychiatric:        Mood and Affect: Mood normal.        Behavior: Behavior normal.     Vital signs in last 24 hours: Temp:  [97.6 F (36.4 C)] 97.6 F (36.4 C) (04/28 1000) Pulse Rate:  [72] 72 (04/28 1000) BP: (143)/(81) 143/81 (04/28 1000) Weight:  [92.1 kg] 92.1 kg (04/28 1000)  Labs:   Estimated body mass index is 29.98 kg/m as calculated from the following:   Height as of this encounter: 5\' 9"  (1.753 m).   Weight as of this encounter: 92.1 kg.   Imaging Review Plain radiographs demonstrate severe degenerative joint disease of the left knee(s). The overall alignment issignificant varus. The bone quality appears to be good for age and reported activity level.      Assessment/Plan:  End stage arthritis, left knee Principal Problem:   Primary localized osteoarthritis of left knee Active Problems:   Hyperlipidemia   Essential hypertension   PAROXYSMAL ATRIAL FIBRILLATION   CORONARY ARTERY BYPASS GRAFT, HX OF   Hyperglycemia   Coronary artery disease    The patient history, physical examination, clinical judgment of the provider and imaging studies are consistent with end stage degenerative joint disease of the left knee(s) and total knee arthroplasty is deemed medically necessary. The treatment options including medical management, injection therapy arthroscopy and arthroplasty were discussed at length. The risks and benefits of total knee arthroplasty were presented and reviewed. The risks due to aseptic loosening, infection, stiffness, patella tracking problems, thromboembolic complications and other imponderables were discussed. The patient acknowledged the explanation, agreed to proceed with the plan and consent was signed. Patient is being admitted for inpatient treatment for surgery, pain control, PT, OT, prophylactic antibiotics, VTE prophylaxis, progressive ambulation and  ADL's and discharge planning. The patient is planning to be discharged to home same day with his son if he is doing well.  Outpatient physical therapy starts Wednesday May 11 at Dr Sherene Sires office in [place] at 9 am arrive at 8:30 for paperwork.  Follow up with Dr Thurston Hole is on Thursday May 19 at 11 am.

## 2021-02-26 NOTE — Progress Notes (Signed)
DUE TO COVID-19 ONLY ONE VISITOR IS ALLOWED TO COME WITH YOU AND STAY IN THE WAITING ROOM ONLY DURING PRE OP AND PROCEDURE DAY OF SURGERY. THE 1 VISITOR  MAY VISIT WITH YOU AFTER SURGERY IN YOUR PRIVATE ROOM DURING VISITING HOURS ONLY!  YOU NEED TO HAVE A COVID 19 TEST ON__5/03/2021 _____ @_______ , THIS TEST MUST BE DONE BEFORE SURGERY,  COVID TESTING SITE [address] [place] , IT IS ON THE RIGHT GOING OUT [address] APPROXIMATELY  2 MINUTES PAST [employer] ON THE RIGHT. ONCE YOUR COVID TEST IS COMPLETED,  PLEASE BEGIN THE QUARANTINE INSTRUCTIONS AS OUTLINED IN YOUR HANDOUT.                Charles House  02/26/2021   Your procedure is scheduled on:                       03/09/2021   Report to [facility] Main  Entrance   Report to admitting at     0515 AM     Call this number if you have problems the morning of surgery [phone]    REMEMBER: NO  SOLID FOOD CANDY OR GUM AFTER MIDNIGHT. CLEAR LIQUIDS UNTIL   0415am       . NOTHING BY MOUTH EXCEPT CLEAR LIQUIDS UNTIL    0415am    . PLEASE FINISH ENSURE DRINK PER SURGEON ORDER  WHICH NEEDS TO BE COMPLETED AT  0415am     .      CLEAR LIQUID DIET   Foods Allowed                                                                    Coffee and tea, regular and decaf                            Fruit ices (not with fruit pulp)                                      Iced Popsicles                                    Carbonated beverages, regular and diet                                    Cranberry, grape and apple juices Sports drinks like Gatorade Lightly seasoned clear broth or consume(fat free) Sugar, honey syrup ___________________________________________________________________      BRUSH YOUR TEETH MORNING OF SURGERY AND RINSE YOUR MOUTH OUT, NO CHEWING GUM CANDY OR MINTS.     Take these medicines the morning of surgery with A SIP OF WATER:                      None  DO NOT TAKE ANY  DIABETIC MEDICATIONS DAY OF YOUR SURGERY  You may not have any metal on your body including hair pins and              piercings  Do not wear jewelry, make-up, lotions, powders or perfumes, deodorant             Do not wear nail polish on your fingernails.  Do not shave  48 hours prior to surgery.              Men may shave face and neck.   Do not bring valuables to the hospital. [place].  Contacts, dentures or bridgework may not be worn into surgery.  Leave suitcase in the car. After surgery it may be brought to your room.     Patients discharged the day of surgery will not be allowed to drive home. IF YOU ARE HAVING SURGERY AND GOING HOME THE SAME DAY, YOU MUST HAVE AN ADULT TO DRIVE YOU HOME AND BE WITH YOU FOR 24 HOURS. YOU MAY GO HOME BY TAXI OR UBER OR ORTHERWISE, BUT AN ADULT MUST ACCOMPANY YOU HOME AND STAY WITH YOU FOR 24 HOURS.  Name and phone number of your driver:  Special Instructions: N/A              Please read over the following fact sheets you were given: _____________________________________________________________________  [facility] - Preparing for Surgery Before surgery, you can play an important role.  Because skin is not sterile, your skin needs to be as free of germs as possible.  You can reduce the number of germs on your skin by washing with CHG (chlorahexidine gluconate) soap before surgery.  CHG is an antiseptic cleaner which kills germs and bonds with the skin to continue killing germs even after washing. Please DO NOT use if you have an allergy to CHG or antibacterial soaps.  If your skin becomes reddened/irritated stop using the CHG and inform your nurse when you arrive at Short Stay. Do not shave (including legs and underarms) for at least 48 hours prior to the first CHG shower.  You may shave your face/neck. Please follow these instructions carefully:  1.  Shower with CHG Soap  the night before surgery and the  morning of Surgery.  2.  If you choose to wash your hair, wash your hair first as usual with your  normal  shampoo.  3.  After you shampoo, rinse your hair and body thoroughly to remove the  shampoo.                           4.  Use CHG as you would any other liquid soap.  You can apply chg directly  to the skin and wash                       Gently with a scrungie or clean washcloth.  5.  Apply the CHG Soap to your body ONLY FROM THE NECK DOWN.   Do not use on face/ open                           Wound or open sores. Avoid contact with eyes, ears mouth and genitals (private parts).  Wash face,  Genitals (private parts) with your normal soap.             6.  Wash thoroughly, paying special attention to the area where your surgery  will be performed.  7.  Thoroughly rinse your body with warm water from the neck down.  8.  DO NOT shower/wash with your normal soap after using and rinsing off  the CHG Soap.                9.  Pat yourself dry with a clean towel.            10.  Wear clean pajamas.            11.  Place clean sheets on your bed the night of your first shower and do not  sleep with pets. Day of Surgery : Do not apply any lotions/deodorants the morning of surgery.  Please wear clean clothes to the hospital/surgery center.  FAILURE TO FOLLOW THESE INSTRUCTIONS MAY RESULT IN THE CANCELLATION OF YOUR SURGERY PATIENT SIGNATURE_________________________________  NURSE SIGNATURE__________________________________  ________________________________________________________________________

## 2021-03-02 ENCOUNTER — Encounter (HOSPITAL_COMMUNITY): Payer: Self-pay

## 2021-03-02 ENCOUNTER — Other Ambulatory Visit: Payer: Self-pay

## 2021-03-02 ENCOUNTER — Encounter (HOSPITAL_COMMUNITY)
Admission: RE | Admit: 2021-03-02 | Discharge: 2021-03-02 | Disposition: A | Discharge status: Home / Self Care | Payer: Medicare HMO | Source: Ambulatory Visit | Attending: Orthopedic Surgery | Admitting: Orthopedic Surgery

## 2021-03-02 DIAGNOSIS — E785 Hyperlipidemia, unspecified: Secondary | ICD-10-CM | POA: Insufficient documentation

## 2021-03-02 DIAGNOSIS — I10 Essential (primary) hypertension: Secondary | ICD-10-CM | POA: Insufficient documentation

## 2021-03-02 DIAGNOSIS — Z79899 Other long term (current) drug therapy: Secondary | ICD-10-CM | POA: Diagnosis not present

## 2021-03-02 DIAGNOSIS — Z01812 Encounter for preprocedural laboratory examination: Secondary | ICD-10-CM | POA: Diagnosis not present

## 2021-03-02 DIAGNOSIS — Z7982 Long term (current) use of aspirin: Secondary | ICD-10-CM | POA: Diagnosis not present

## 2021-03-02 DIAGNOSIS — I2510 Atherosclerotic heart disease of native coronary artery without angina pectoris: Secondary | ICD-10-CM | POA: Diagnosis not present

## 2021-03-02 DIAGNOSIS — Z951 Presence of aortocoronary bypass graft: Secondary | ICD-10-CM | POA: Insufficient documentation

## 2021-03-02 DIAGNOSIS — M1712 Unilateral primary osteoarthritis, left knee: Secondary | ICD-10-CM | POA: Diagnosis not present

## 2021-03-02 DIAGNOSIS — I252 Old myocardial infarction: Secondary | ICD-10-CM | POA: Diagnosis not present

## 2021-03-02 DIAGNOSIS — G4733 Obstructive sleep apnea (adult) (pediatric): Secondary | ICD-10-CM | POA: Diagnosis not present

## 2021-03-02 DIAGNOSIS — Z7902 Long term (current) use of antithrombotics/antiplatelets: Secondary | ICD-10-CM | POA: Diagnosis not present

## 2021-03-02 HISTORY — DX: Pneumonia, unspecified organism: J18.9

## 2021-03-02 LAB — CBC WITH DIFFERENTIAL/PLATELET
Abs Immature Granulocytes: 0.01 10*3/uL (ref 0.00–0.07)
Basophils Absolute: 0 10*3/uL (ref 0.0–0.1)
Basophils Relative: 1 %
Eosinophils Absolute: 0.1 10*3/uL (ref 0.0–0.5)
Eosinophils Relative: 2 %
HCT: 47.6 % (ref 39.0–52.0)
Hemoglobin: 15.6 g/dL (ref 13.0–17.0)
Immature Granulocytes: 0 %
Lymphocytes Relative: 31 %
Lymphs Abs: 1.8 10*3/uL (ref 0.7–4.0)
MCH: 30.2 pg (ref 26.0–34.0)
MCHC: 32.8 g/dL (ref 30.0–36.0)
MCV: 92.1 fL (ref 80.0–100.0)
Monocytes Absolute: 0.5 10*3/uL (ref 0.1–1.0)
Monocytes Relative: 9 %
Neutro Abs: 3.4 10*3/uL (ref 1.7–7.7)
Neutrophils Relative %: 57 %
Platelets: 182 10*3/uL (ref 150–400)
RBC: 5.17 MIL/uL (ref 4.22–5.81)
RDW: 13.2 % (ref 11.5–15.5)
WBC: 5.9 10*3/uL (ref 4.0–10.5)
nRBC: 0 % (ref 0.0–0.2)

## 2021-03-02 LAB — PROTIME-INR
INR: 1 (ref 0.8–1.2)
Prothrombin Time: 13.3 seconds (ref 11.4–15.2)

## 2021-03-02 LAB — COMPREHENSIVE METABOLIC PANEL
ALT: 24 U/L (ref 0–44)
AST: 22 U/L (ref 15–41)
Albumin: 4.2 g/dL (ref 3.5–5.0)
Alkaline Phosphatase: 89 U/L (ref 38–126)
Anion gap: 7 (ref 5–15)
BUN: 17 mg/dL (ref 8–23)
CO2: 25 mmol/L (ref 22–32)
Calcium: 9.2 mg/dL (ref 8.9–10.3)
Chloride: 108 mmol/L (ref 98–111)
Creatinine, Ser: 0.88 mg/dL (ref 0.61–1.24)
GFR, Estimated: >60 mL/min (ref >60)
Glucose, Bld: 114 mg/dL — ABNORMAL HIGH (ref 70–99)
Potassium: 4.2 mmol/L (ref 3.5–5.1)
Sodium: 140 mmol/L (ref 135–145)
Total Bilirubin: 1.1 mg/dL (ref 0.3–1.2)
Total Protein: 7.3 g/dL (ref 6.5–8.1)

## 2021-03-02 LAB — APTT: aPTT: 29 seconds (ref 24–36)

## 2021-03-02 LAB — SURGICAL PCR SCREEN
MRSA, PCR: NEGATIVE
Staphylococcus aureus: NEGATIVE

## 2021-03-02 NOTE — Care Plan (Signed)
Ortho Bundle Case Management Note  Patient Details  Name: Charles House MRN: [id number] Date of Birth: 09/21/1941  Met with patient in the office for H&P visit. He will discharge to home with his son to assist.  [address] [place], [place] [address]. Rolling walker and CPM ordered for home. OPPT set up with [place]. Patient and MD in agreement with plan. Choice offered.                    DME Arranged:  Gilford Rile rolling,CPM DME Agency:  [employer]  HH Arranged:    [place] Agency:     Additional Comments: Please contact me with any questions of if this plan should need to change.  Ladell Heads,  [place] [facility]  [phone] 03/02/2021, 2:38 PM

## 2021-03-02 NOTE — Progress Notes (Addendum)
Anesthesia Review:  PCP: DR [name]  Cardiologist :  DR Allred ClearanceCheryle Horsfall- 12/30/20  08/20/2020- LOV with DR Allred  Chest x-ray : EKG : 08/20/2020  Echo : Stress test: Cardiac Cath :  Activity level: can do a flight of stairs without difficulty  Sleep Study/ CPAP : none  Fasting Blood Sugar :      / Checks Blood Sugar -- times a day:   Blood Thinner/ Instructions /Last Dose: ASA / Instructions/ Last Dose :  Plavix - Last dose per pt on 03/01/21  ASA- 81 mg  Urine culture done 03/02/21 routed to Dr Thurston Hole on 03/03/21.

## 2021-03-03 LAB — URINE CULTURE: Culture: NO GROWTH

## 2021-03-03 NOTE — Progress Notes (Signed)
Anesthesia Chart Review   Case: [id number] Date/Time: 03/09/21 0700   Procedure: TOTAL KNEE ARTHROPLASTY (Left Knee)   Anesthesia type: Spinal   Pre-op diagnosis: djd left knee   Location: [facility] ROOM 05 / [facility]   Surgeons: Salvatore Marvel, MD      DISCUSSION:80 y.o. never smoker with h/o HTN, CAD (CABG 2012, DES to RCS 2016, remote h/o PAF, djd left knee scheduled for above procedure 03/09/2021 with Dr. Salvatore Marvel.   Per cardiology preoperative evaluation 12/30/20, "Chart reviewed as part of pre-operative protocol coverage.  Hx: CAD, s/p CABG in 2010, s/p inferior STEMI 12/2014, s/p DES x2 to the RCA; HTN, HLD, remote paroxysmal A. fib, OSA. Last seen by Dr. [name]: 08/2020 RCRI:  Perioperative Risk of Major Cardiac Event is (%): 0.9 (low risk) DASI:  Functional Capacity in METs is: 4.31 (functional status is fair ) Patient was contacted 12/30/2020 in reference to pre-operative risk assessment for pending surgery as outlined below.   Since last seen, Charles House has done well without chest pain or shortness of breath.  Recommendations:  Therefore, based on ACC/AHA guidelines, the patient is at acceptable risk for the planned procedure without further cardiovascular testing.   Clopidogrel (Plavix) can be held for 7 days prior to surgery and resumed postoperatively as soon as it is felt to be safe.  Ideally, the patient should remain on aspirin throughout the perioperative period.  However, if the bleeding risk is too great, he may hold aspirin for 7 days prior to surgery and resume postoperatively when felt to be safe."  Last dose of Plavix 03/01/21.   Anticipate pt can proceed with planned procedure barring acute status change.   VS: BP (!) 144/85   Pulse 66   Temp 36.8 C (Oral)   Resp 16   Ht 5\' 10"  (1.778 m)   Wt 90.7 kg   SpO2 97%   BMI 28.70 kg/m   PROVIDERS: , MD is PCP   [name], MD is Cardiologist  LABS: Labs reviewed: Acceptable for surgery. (all  labs ordered are listed, but only abnormal results are displayed)  Labs Reviewed  COMPREHENSIVE METABOLIC PANEL - Abnormal; Notable for the following components:      Result Value   Glucose, Bld 114 (*)    All other components within normal limits  SURGICAL PCR SCREEN  URINE CULTURE  CBC WITH DIFFERENTIAL/PLATELET  PROTIME-INR  APTT     IMAGES:   EKG: 08/20/2020 Rate 79 bpm  Sinus   CV: Echo 01/24/2015 Study Conclusions   - Left ventricle: The cavity size was normal. Wall thickness was  increased in a pattern of mild LVH. Systolic function was normal.  The estimated ejection fraction was in the range of 55% to 60%.  - Aortic valve: There was trivial regurgitation.   Past Medical History:  Diagnosis Date  . Atrial fibrillation (HCC)    once in setting of a spider bite 1/10, without recurrence  . Carotid artery occlusion    0-39% stenosis by 01/26/2015 4/11  . Coronary artery disease    a. s/p 6V CABG 11/18/08. b. acute inferior STEMI secondary to acute occlusion proximal RCA s/p successful PTCA/DES x 2 proximal and mid RCA, EF 50-55%  . Hyperglycemia   . Hyperlipidemia   . Hypertension   . Pneumonia    hx of   . Primary localized osteoarthritis of left knee   . Sinus bradycardia    a. Not on BB due to this. Previously did  not tolerate BB.    Past Surgical History:  Procedure Laterality Date  . CORONARY ANGIOPLASTY    . CORONARY ARTERY BYPASS GRAFT  1/10   by Dr Tyrone Sage  . KNEE ARTHROSCOPY    . LEFT HEART CATHETERIZATION WITH CORONARY ANGIOGRAM N/A 01/23/2015   Procedure: LEFT HEART CATHETERIZATION WITH CORONARY ANGIOGRAM;  Surgeon: Kathleene Hazel, MD;  Location: [facility] CATH LAB;  Service: Cardiovascular;  Laterality: N/A;  . PERCUTANEOUS CORONARY STENT INTERVENTION (PCI-S)  01/23/2015   Procedure: PERCUTANEOUS CORONARY STENT INTERVENTION (PCI-S);  Surgeon: Kathleene Hazel, MD;  Location: [facility] CATH LAB;  Service: Cardiovascular;;  . TONSILLECTOMY       MEDICATIONS: . acetaminophen (TYLENOL) 500 MG tablet  . aspirin 81 MG tablet  . atorvastatin (LIPITOR) 80 MG tablet  . Cholecalciferol (VITAMIN D-3) 25 MCG (1000 UT) CAPS  . clopidogrel (PLAVIX) 75 MG tablet  . diclofenac Sodium (VOLTAREN) 1 % GEL  . nitroGLYCERIN (NITROSTAT) 0.4 MG SL tablet   No current facility-administered medications for this encounter.    Jodell Cipro, PA-C [facility] Pre-Surgical Testing [phone]

## 2021-03-05 ENCOUNTER — Other Ambulatory Visit (HOSPITAL_COMMUNITY)
Admission: RE | Admit: 2021-03-05 | Discharge: 2021-03-05 | Disposition: A | Discharge status: Home / Self Care | Payer: Medicare HMO | Source: Ambulatory Visit | Attending: Orthopedic Surgery | Admitting: Orthopedic Surgery

## 2021-03-05 DIAGNOSIS — Z01812 Encounter for preprocedural laboratory examination: Secondary | ICD-10-CM | POA: Insufficient documentation

## 2021-03-05 DIAGNOSIS — Z20822 Contact with and (suspected) exposure to covid-19: Secondary | ICD-10-CM | POA: Diagnosis not present

## 2021-03-06 LAB — SARS CORONAVIRUS 2 (TAT 6-24 HRS): SARS Coronavirus 2: NEGATIVE

## 2021-03-08 MED ORDER — BUPIVACAINE LIPOSOME 1.3 % IJ SUSP
20.0000 mL | INTRAMUSCULAR | Status: DC
  Filled 2021-03-08: qty 20

## 2021-03-08 NOTE — Anesthesia Preprocedure Evaluation (Addendum)
Anesthesia Evaluation  Patient identified by MRN, date of birth, ID band Patient awake    Reviewed: Allergy & Precautions, NPO status , Patient's Chart, lab work & pertinent test results  Airway Mallampati: III  TM Distance: >3 FB Neck ROM: Full    Dental no notable dental hx. (+) Lower Dentures, Upper Dentures   Pulmonary    Pulmonary exam normal breath sounds clear to auscultation       Cardiovascular Exercise Tolerance: Good hypertension, + CAD, + Past MI and + CABG  Normal cardiovascular exam+ dysrhythmias (paroxysmal on Plavix) Atrial Fibrillation  Rhythm:Regular Rate:Normal     Neuro/Psych negative neurological ROS  negative psych ROS   GI/Hepatic negative GI ROS, Neg liver ROS,   Endo/Other  negative endocrine ROS  Renal/GU negative Renal ROS  negative genitourinary   Musculoskeletal  (+) Arthritis ,   Abdominal   Peds negative pediatric ROS (+)  Hematology negative hematology ROS (+)   Anesthesia Other Findings   Reproductive/Obstetrics                            Anesthesia Physical Anesthesia Plan  ASA: III  Anesthesia Plan: Spinal, MAC and Regional   Post-op Pain Management:  Regional for Post-op pain   Induction:   PONV Risk Score and Plan: Propofol infusion, TIVA and Treatment may vary due to age or medical condition  Airway Management Planned: Natural Airway and Simple Face Mask  Additional Equipment: None  Intra-op Plan:   Post-operative Plan:   Informed Consent: I have reviewed the patients History and Physical, chart, labs and discussed the procedure including the risks, benefits and alternatives for the proposed anesthesia with the patient or authorized representative who has indicated his/her understanding and acceptance.       Plan Discussed with: CRNA and Anesthesiologist  Anesthesia Plan Comments: (Adductor canal block. Spinal depending on last dose  of Plavix. Last dose of Plavix 7 days ago. OK for spinal. GA/LMA as backup. )      Anesthesia Quick Evaluation

## 2021-03-09 ENCOUNTER — Encounter (HOSPITAL_COMMUNITY)
Admission: RE | Disposition: A | Discharge status: Home / Self Care | Payer: Self-pay | Source: Home / Self Care | Attending: Orthopedic Surgery

## 2021-03-09 ENCOUNTER — Observation Stay (HOSPITAL_COMMUNITY)
Admission: RE | Admit: 2021-03-09 | Discharge: 2021-03-10 | Disposition: A | Discharge status: Home / Self Care | Payer: Medicare HMO | Attending: Orthopedic Surgery | Admitting: Orthopedic Surgery

## 2021-03-09 ENCOUNTER — Ambulatory Visit (HOSPITAL_COMMUNITY): Payer: Medicare HMO | Admitting: Physician Assistant

## 2021-03-09 ENCOUNTER — Ambulatory Visit (HOSPITAL_COMMUNITY): Payer: Medicare HMO | Admitting: Anesthesiology

## 2021-03-09 ENCOUNTER — Encounter (HOSPITAL_COMMUNITY): Payer: Self-pay | Admitting: Orthopedic Surgery

## 2021-03-09 ENCOUNTER — Other Ambulatory Visit: Payer: Self-pay

## 2021-03-09 DIAGNOSIS — G8918 Other acute postprocedural pain: Secondary | ICD-10-CM | POA: Diagnosis not present

## 2021-03-09 DIAGNOSIS — Z79899 Other long term (current) drug therapy: Secondary | ICD-10-CM | POA: Diagnosis not present

## 2021-03-09 DIAGNOSIS — Z951 Presence of aortocoronary bypass graft: Secondary | ICD-10-CM | POA: Diagnosis not present

## 2021-03-09 DIAGNOSIS — Z20822 Contact with and (suspected) exposure to covid-19: Secondary | ICD-10-CM | POA: Insufficient documentation

## 2021-03-09 DIAGNOSIS — Z7902 Long term (current) use of antithrombotics/antiplatelets: Secondary | ICD-10-CM | POA: Diagnosis not present

## 2021-03-09 DIAGNOSIS — E785 Hyperlipidemia, unspecified: Secondary | ICD-10-CM | POA: Diagnosis not present

## 2021-03-09 DIAGNOSIS — I480 Paroxysmal atrial fibrillation: Secondary | ICD-10-CM | POA: Insufficient documentation

## 2021-03-09 DIAGNOSIS — R739 Hyperglycemia, unspecified: Secondary | ICD-10-CM | POA: Diagnosis present

## 2021-03-09 DIAGNOSIS — I10 Essential (primary) hypertension: Secondary | ICD-10-CM | POA: Diagnosis not present

## 2021-03-09 DIAGNOSIS — Z7982 Long term (current) use of aspirin: Secondary | ICD-10-CM | POA: Insufficient documentation

## 2021-03-09 DIAGNOSIS — I2510 Atherosclerotic heart disease of native coronary artery without angina pectoris: Secondary | ICD-10-CM | POA: Diagnosis present

## 2021-03-09 DIAGNOSIS — I4891 Unspecified atrial fibrillation: Secondary | ICD-10-CM | POA: Diagnosis present

## 2021-03-09 DIAGNOSIS — M1712 Unilateral primary osteoarthritis, left knee: Secondary | ICD-10-CM | POA: Diagnosis not present

## 2021-03-09 HISTORY — PX: TOTAL KNEE ARTHROPLASTY: SHX125

## 2021-03-09 HISTORY — DX: Unilateral primary osteoarthritis, left knee: M17.12

## 2021-03-09 SURGERY — ARTHROPLASTY, KNEE, TOTAL
Anesthesia: Monitor Anesthesia Care | Site: Knee | Laterality: Left

## 2021-03-09 MED ORDER — POVIDONE-IODINE 7.5 % EX SOLN: Freq: Once | CUTANEOUS | Status: AC

## 2021-03-09 MED ORDER — CEFAZOLIN SODIUM-DEXTROSE 2-4 GM/100ML-% IV SOLN
2.0000 g | Freq: Four times a day (QID) | INTRAVENOUS | Status: AC
  Administered 2021-03-09 (×2): 2 g via INTRAVENOUS
  Filled 2021-03-09 (×2): qty 100

## 2021-03-09 MED ORDER — BUPIVACAINE IN DEXTROSE 0.75-8.25 % IT SOLN
INTRATHECAL | Status: DC | PRN
  Administered 2021-03-09: 1.6 mL via INTRATHECAL

## 2021-03-09 MED ORDER — POLYETHYLENE GLYCOL 3350 17 G PO PACK
17.0000 g | PACK | Freq: Two times a day (BID) | ORAL | Status: DC
  Administered 2021-03-09: 17 g via ORAL
  Filled 2021-03-09: qty 1

## 2021-03-09 MED ORDER — ACETAMINOPHEN 500 MG PO TABS
1000.0000 mg | ORAL_TABLET | Freq: Once | ORAL | Status: AC
  Administered 2021-03-09: 1000 mg via ORAL
  Filled 2021-03-09: qty 2

## 2021-03-09 MED ORDER — ONDANSETRON HCL 4 MG/2ML IJ SOLN: 4.0000 mg | Freq: Once | INTRAMUSCULAR | Status: DC | PRN

## 2021-03-09 MED ORDER — DEXAMETHASONE SODIUM PHOSPHATE 10 MG/ML IJ SOLN
8.0000 mg | Freq: Once | INTRAMUSCULAR | Status: AC
  Administered 2021-03-09: 8 mg via INTRAVENOUS

## 2021-03-09 MED ORDER — SODIUM CHLORIDE (PF) 0.9 % IJ SOLN
INTRAMUSCULAR | Status: DC | PRN
  Administered 2021-03-09: 50 mL

## 2021-03-09 MED ORDER — HYDROMORPHONE HCL 1 MG/ML IJ SOLN: 0.5000 mg | INTRAMUSCULAR | Status: DC | PRN

## 2021-03-09 MED ORDER — HYDROMORPHONE HCL 1 MG/ML IJ SOLN
INTRAMUSCULAR | Status: AC
  Administered 2021-03-09: 1 mg
  Filled 2021-03-09: qty 1

## 2021-03-09 MED ORDER — CLOPIDOGREL BISULFATE 75 MG PO TABS
75.0000 mg | ORAL_TABLET | Freq: Every day | ORAL | Status: DC
  Administered 2021-03-10: 75 mg via ORAL
  Filled 2021-03-09: qty 1

## 2021-03-09 MED ORDER — PHENYLEPHRINE HCL (PRESSORS) 10 MG/ML IV SOLN
INTRAVENOUS | Status: AC
  Filled 2021-03-09: qty 1

## 2021-03-09 MED ORDER — FENTANYL CITRATE (PF) 100 MCG/2ML IJ SOLN
INTRAMUSCULAR | Status: DC | PRN
  Administered 2021-03-09: 50 ug via INTRAVENOUS

## 2021-03-09 MED ORDER — POLYETHYLENE GLYCOL 3350 17 G PO PACK: 17.0000 g | PACK | Freq: Two times a day (BID) | ORAL | 0 refills | Status: DC

## 2021-03-09 MED ORDER — BUPIVACAINE-EPINEPHRINE 0.25% -1:200000 IJ SOLN
INTRAMUSCULAR | Status: DC | PRN
  Administered 2021-03-09: 30 mL

## 2021-03-09 MED ORDER — MIDAZOLAM HCL 2 MG/2ML IJ SOLN
INTRAMUSCULAR | Status: AC
  Filled 2021-03-09: qty 2

## 2021-03-09 MED ORDER — CEFAZOLIN SODIUM-DEXTROSE 2-4 GM/100ML-% IV SOLN
2.0000 g | INTRAVENOUS | Status: AC
  Administered 2021-03-09: 2 g via INTRAVENOUS
  Filled 2021-03-09: qty 100

## 2021-03-09 MED ORDER — SODIUM CHLORIDE (PF) 0.9 % IJ SOLN
INTRAMUSCULAR | Status: AC
  Filled 2021-03-09: qty 50

## 2021-03-09 MED ORDER — PHENOL 1.4 % MT LIQD: 1.0000 | OROMUCOSAL | Status: DC | PRN

## 2021-03-09 MED ORDER — ALUM & MAG HYDROXIDE-SIMETH 200-200-20 MG/5ML PO SUSP: 30.0000 mL | ORAL | Status: DC | PRN

## 2021-03-09 MED ORDER — FENTANYL CITRATE (PF) 100 MCG/2ML IJ SOLN
INTRAMUSCULAR | Status: AC
  Filled 2021-03-09: qty 2

## 2021-03-09 MED ORDER — POVIDONE-IODINE 10 % EX SWAB: 2.0000 "application " | Freq: Once | CUTANEOUS | Status: DC

## 2021-03-09 MED ORDER — DEXAMETHASONE SODIUM PHOSPHATE 10 MG/ML IJ SOLN
INTRAMUSCULAR | Status: AC
  Filled 2021-03-09: qty 1

## 2021-03-09 MED ORDER — MENTHOL 3 MG MT LOZG: 1.0000 | LOZENGE | OROMUCOSAL | Status: DC | PRN

## 2021-03-09 MED ORDER — OXYCODONE HCL 5 MG PO TABS
5.0000 mg | ORAL_TABLET | Freq: Once | ORAL | Status: AC | PRN
  Administered 2021-03-09: 5 mg via ORAL

## 2021-03-09 MED ORDER — DOCUSATE SODIUM 100 MG PO CAPS
100.0000 mg | ORAL_CAPSULE | Freq: Two times a day (BID) | ORAL | Status: DC
  Administered 2021-03-09 – 2021-03-10 (×2): 100 mg via ORAL
  Filled 2021-03-09 (×2): qty 1

## 2021-03-09 MED ORDER — ONDANSETRON HCL 4 MG/2ML IJ SOLN
INTRAMUSCULAR | Status: AC
  Filled 2021-03-09: qty 2

## 2021-03-09 MED ORDER — FENTANYL CITRATE (PF) 100 MCG/2ML IJ SOLN: 25.0000 ug | INTRAMUSCULAR | Status: DC | PRN

## 2021-03-09 MED ORDER — WATER FOR IRRIGATION, STERILE IR SOLN
Status: DC | PRN
  Administered 2021-03-09: 2000 mL

## 2021-03-09 MED ORDER — LACTATED RINGERS IV BOLUS
250.0000 mL | Freq: Once | INTRAVENOUS | Status: AC
  Administered 2021-03-09: 250 mL via INTRAVENOUS

## 2021-03-09 MED ORDER — 0.9 % SODIUM CHLORIDE (POUR BTL) OPTIME
TOPICAL | Status: DC | PRN
  Administered 2021-03-09: 1000 mL

## 2021-03-09 MED ORDER — SODIUM CHLORIDE 0.9 % IR SOLN
Status: DC | PRN
  Administered 2021-03-09: 1000 mL

## 2021-03-09 MED ORDER — DIPHENHYDRAMINE HCL 12.5 MG/5ML PO ELIX: 12.5000 mg | ORAL_SOLUTION | ORAL | Status: DC | PRN

## 2021-03-09 MED ORDER — TRANEXAMIC ACID-NACL 1000-0.7 MG/100ML-% IV SOLN
1000.0000 mg | Freq: Once | INTRAVENOUS | Status: AC
  Administered 2021-03-09: 1000 mg via INTRAVENOUS
  Filled 2021-03-09: qty 100

## 2021-03-09 MED ORDER — EPHEDRINE 5 MG/ML INJ
INTRAVENOUS | Status: AC
  Filled 2021-03-09: qty 10

## 2021-03-09 MED ORDER — OXYCODONE HCL 5 MG PO TABS
5.0000 mg | ORAL_TABLET | ORAL | Status: DC | PRN
  Administered 2021-03-09 – 2021-03-10 (×4): 10 mg via ORAL
  Filled 2021-03-09 (×3): qty 2

## 2021-03-09 MED ORDER — ONDANSETRON HCL 4 MG PO TABS: 4.0000 mg | ORAL_TABLET | Freq: Four times a day (QID) | ORAL | Status: DC | PRN

## 2021-03-09 MED ORDER — METOCLOPRAMIDE HCL 5 MG PO TABS: 5.0000 mg | ORAL_TABLET | Freq: Three times a day (TID) | ORAL | Status: DC | PRN

## 2021-03-09 MED ORDER — OXYCODONE HCL 5 MG/5ML PO SOLN
5.0000 mg | Freq: Once | ORAL | Status: AC | PRN
Start: 2021-03-09 — End: 2021-03-09

## 2021-03-09 MED ORDER — POTASSIUM CHLORIDE IN NACL 20-0.9 MEQ/L-% IV SOLN
INTRAVENOUS | Status: DC
  Filled 2021-03-09 (×3): qty 1000

## 2021-03-09 MED ORDER — ONDANSETRON HCL 4 MG/2ML IJ SOLN
INTRAMUSCULAR | Status: DC | PRN
  Administered 2021-03-09: 4 mg via INTRAVENOUS

## 2021-03-09 MED ORDER — DOCUSATE SODIUM 100 MG PO CAPS: ORAL_CAPSULE | ORAL | 2 refills | Status: DC

## 2021-03-09 MED ORDER — PHENYLEPHRINE HCL-NACL 10-0.9 MG/250ML-% IV SOLN
INTRAVENOUS | Status: DC | PRN
  Administered 2021-03-09: 25 ug/min via INTRAVENOUS

## 2021-03-09 MED ORDER — OXYCODONE HCL 5 MG PO TABS
10.0000 mg | ORAL_TABLET | ORAL | Status: DC | PRN
  Administered 2021-03-09: 15 mg via ORAL
  Filled 2021-03-09: qty 3

## 2021-03-09 MED ORDER — DEXAMETHASONE SODIUM PHOSPHATE 10 MG/ML IJ SOLN
10.0000 mg | Freq: Three times a day (TID) | INTRAMUSCULAR | Status: AC
  Administered 2021-03-09: 10 mg via INTRAVENOUS
  Filled 2021-03-09: qty 1

## 2021-03-09 MED ORDER — ACETAMINOPHEN 325 MG PO TABS: 325.0000 mg | ORAL_TABLET | Freq: Four times a day (QID) | ORAL | Status: DC | PRN

## 2021-03-09 MED ORDER — ACETAMINOPHEN 500 MG PO TABS
1000.0000 mg | ORAL_TABLET | Freq: Four times a day (QID) | ORAL | Status: AC
  Administered 2021-03-09 – 2021-03-10 (×4): 1000 mg via ORAL
  Filled 2021-03-09 (×4): qty 2

## 2021-03-09 MED ORDER — BUPIVACAINE LIPOSOME 1.3 % IJ SUSP
INTRAMUSCULAR | Status: DC | PRN
  Administered 2021-03-09: 20 mL

## 2021-03-09 MED ORDER — PROPOFOL 10 MG/ML IV BOLUS
INTRAVENOUS | Status: AC
  Filled 2021-03-09: qty 20

## 2021-03-09 MED ORDER — BUPIVACAINE-EPINEPHRINE (PF) 0.25% -1:200000 IJ SOLN
INTRAMUSCULAR | Status: AC
  Filled 2021-03-09: qty 30

## 2021-03-09 MED ORDER — OXYCODONE HCL 5 MG PO TABS
ORAL_TABLET | ORAL | Status: AC
  Filled 2021-03-09: qty 1

## 2021-03-09 MED ORDER — CEFUROXIME SODIUM 1.5 G IV SOLR
INTRAVENOUS | Status: AC
  Filled 2021-03-09: qty 1.5

## 2021-03-09 MED ORDER — GABAPENTIN 300 MG PO CAPS: ORAL_CAPSULE | ORAL | 0 refills | Status: DC

## 2021-03-09 MED ORDER — ASPIRIN 81 MG PO TABS: ORAL_TABLET | ORAL | 0 refills | Status: DC

## 2021-03-09 MED ORDER — NITROGLYCERIN 0.4 MG SL SUBL: 0.4000 mg | SUBLINGUAL_TABLET | SUBLINGUAL | Status: DC | PRN

## 2021-03-09 MED ORDER — LACTATED RINGERS IV BOLUS
500.0000 mL | Freq: Once | INTRAVENOUS | Status: AC
  Administered 2021-03-09: 500 mL via INTRAVENOUS

## 2021-03-09 MED ORDER — PROPOFOL 500 MG/50ML IV EMUL
INTRAVENOUS | Status: DC | PRN
  Administered 2021-03-09: 75 ug/kg/min via INTRAVENOUS

## 2021-03-09 MED ORDER — TRANEXAMIC ACID-NACL 1000-0.7 MG/100ML-% IV SOLN
1000.0000 mg | INTRAVENOUS | Status: AC
  Administered 2021-03-09: 1000 mg via INTRAVENOUS
  Filled 2021-03-09: qty 100

## 2021-03-09 MED ORDER — LACTATED RINGERS IV SOLN: INTRAVENOUS | Status: DC

## 2021-03-09 MED ORDER — ASPIRIN EC 325 MG PO TBEC
325.0000 mg | DELAYED_RELEASE_TABLET | Freq: Every day | ORAL | Status: DC
  Administered 2021-03-10: 325 mg via ORAL
  Filled 2021-03-09: qty 1

## 2021-03-09 MED ORDER — EPHEDRINE SULFATE-NACL 50-0.9 MG/10ML-% IV SOSY
PREFILLED_SYRINGE | INTRAVENOUS | Status: DC | PRN
  Administered 2021-03-09: 10 mg via INTRAVENOUS

## 2021-03-09 MED ORDER — BUPIVACAINE HCL (PF) 0.5 % IJ SOLN
INTRAMUSCULAR | Status: DC | PRN
  Administered 2021-03-09: 20 mL via PERINEURAL

## 2021-03-09 MED ORDER — OXYCODONE HCL 5 MG PO TABS
5.0000 mg | ORAL_TABLET | ORAL | Status: DC | PRN
  Filled 2021-03-09: qty 2

## 2021-03-09 MED ORDER — ONDANSETRON HCL 4 MG/2ML IJ SOLN: 4.0000 mg | Freq: Four times a day (QID) | INTRAMUSCULAR | Status: DC | PRN

## 2021-03-09 MED ORDER — OXYCODONE HCL 5 MG PO TABS: 5.0000 mg | ORAL_TABLET | ORAL | 0 refills | Status: DC | PRN

## 2021-03-09 MED ORDER — METOCLOPRAMIDE HCL 5 MG/ML IJ SOLN: 5.0000 mg | Freq: Three times a day (TID) | INTRAMUSCULAR | Status: DC | PRN

## 2021-03-09 MED ORDER — MIDAZOLAM HCL 2 MG/2ML IJ SOLN
INTRAMUSCULAR | Status: DC | PRN
  Administered 2021-03-09: 2 mg via INTRAVENOUS

## 2021-03-09 SURGICAL SUPPLY — 57 items
ATTUNE MED DOME PAT 38 KNEE (Knees) ×2 IMPLANT
ATTUNE PS FEM LT SZ 5 CEM KNEE (Femur) ×2 IMPLANT
ATTUNE PSRP INSR SZ5 6 KNEE (Insert) ×2 IMPLANT
BAG ZIPLOCK 12X15 (MISCELLANEOUS) ×2 IMPLANT
BASE TIBIAL ROT PLAT SZ 7 KNEE (Knees) ×1 IMPLANT
BLADE SAGITTAL 25.0X1.19X90 (BLADE) ×2 IMPLANT
BLADE SAW SGTL 13X75X1.27 (BLADE) ×2 IMPLANT
BLADE SURG SZ10 CARB STEEL (BLADE) ×6 IMPLANT
BOWL SMART MIX CTS (DISPOSABLE) ×2 IMPLANT
CEMENT HV SMART SET (Cement) ×4 IMPLANT
CHLORAPREP W/TINT 26 (MISCELLANEOUS) ×4 IMPLANT
COVER SURGICAL LIGHT HANDLE (MISCELLANEOUS) ×2 IMPLANT
COVER WAND RF STERILE (DRAPES) IMPLANT
CUFF TOURN SGL QUICK 34 (TOURNIQUET CUFF) ×2
CUFF TRNQT CYL 34X4.125X (TOURNIQUET CUFF) ×1 IMPLANT
DECANTER SPIKE VIAL GLASS SM (MISCELLANEOUS) ×4 IMPLANT
DRAPE ORTHO 2.5IN SPLIT 77X108 (DRAPES) ×1 IMPLANT
DRAPE ORTHO SPLIT 77X108 STRL (DRAPES) ×2
DRAPE SHEET LG 3/4 BI-LAMINATE (DRAPES) ×2 IMPLANT
DRAPE U-SHAPE 47X51 STRL (DRAPES) ×2 IMPLANT
DRSG AQUACEL AG ADV 3.5X10 (GAUZE/BANDAGES/DRESSINGS) ×2 IMPLANT
DRSG PAD ABDOMINAL 8X10 ST (GAUZE/BANDAGES/DRESSINGS) ×4 IMPLANT
ELECT REM PT RETURN 15FT ADLT (MISCELLANEOUS) ×2 IMPLANT
GLOVE SURG ENC MOIS LTX SZ7 (GLOVE) ×2 IMPLANT
GLOVE SURG MICRO LTX SZ7.5 (GLOVE) ×2 IMPLANT
GLOVE SURG UNDER POLY LF SZ7 (GLOVE) ×2 IMPLANT
GLOVE SURG UNDER POLY LF SZ7.5 (GLOVE) ×2 IMPLANT
GOWN STRL REUS W/ TWL LRG LVL3 (GOWN DISPOSABLE) ×2 IMPLANT
GOWN STRL REUS W/TWL LRG LVL3 (GOWN DISPOSABLE) ×4
HANDPIECE INTERPULSE COAX TIP (DISPOSABLE) ×2
HOLDER FOLEY CATH W/STRAP (MISCELLANEOUS) ×2 IMPLANT
HOOD PEEL AWAY FLYTE STAYCOOL (MISCELLANEOUS) ×6 IMPLANT
KIT TURNOVER KIT A (KITS) ×2 IMPLANT
MANIFOLD NEPTUNE II (INSTRUMENTS) ×2 IMPLANT
MARKER SKIN DUAL TIP RULER LAB (MISCELLANEOUS) ×2 IMPLANT
NDL SAFETY ECLIPSE 18X1.5 (NEEDLE) ×1 IMPLANT
NEEDLE HYPO 18GX1.5 SHARP (NEEDLE) ×2
NS IRRIG 1000ML POUR BTL (IV SOLUTION) ×2 IMPLANT
PACK TOTAL KNEE CUSTOM (KITS) ×2 IMPLANT
PENCIL SMOKE EVACUATOR (MISCELLANEOUS) ×2 IMPLANT
PIN DRILL FIX HALF THREAD (BIT) ×2 IMPLANT
PIN STEINMAN FIXATION KNEE (PIN) ×2 IMPLANT
PROTECTOR NERVE ULNAR (MISCELLANEOUS) ×2 IMPLANT
SET HNDPC FAN SPRY TIP SCT (DISPOSABLE) ×1 IMPLANT
STAPLER VISISTAT 35W (STAPLE) IMPLANT
STRIP CLOSURE SKIN 1/2X4 (GAUZE/BANDAGES/DRESSINGS) ×2 IMPLANT
SUT MNCRL AB 3-0 PS2 18 (SUTURE) ×2 IMPLANT
SUT VIC AB 0 CT1 36 (SUTURE) ×2 IMPLANT
SUT VIC AB 1 CT1 27 (SUTURE) ×2
SUT VIC AB 1 CT1 27XBRD ANTBC (SUTURE) ×1 IMPLANT
SUT VIC AB 1 CT1 36 (SUTURE) ×2 IMPLANT
SUT VIC AB 2-0 CT1 27 (SUTURE) ×4
SUT VIC AB 2-0 CT1 TAPERPNT 27 (SUTURE) ×2 IMPLANT
SYR 30ML LL (SYRINGE) ×4 IMPLANT
TIBIAL BASE ROT PLAT SZ 7 KNEE (Knees) ×2 IMPLANT
TRAY FOLEY MTR SLVR 14FR STAT (SET/KITS/TRAYS/PACK) ×2 IMPLANT
WATER STERILE IRR 1000ML POUR (IV SOLUTION) ×4 IMPLANT

## 2021-03-09 NOTE — Interval H&P Note (Signed)
History and Physical Interval Note:  03/09/2021 6:23 AM  Charles House  has presented today for surgery, with the diagnosis of djd left knee.  The various methods of treatment have been discussed with the patient and family. After consideration of risks, benefits and other options for treatment, the patient has consented to  Procedure(s): TOTAL KNEE ARTHROPLASTY (Left) as a surgical intervention.  The patient's history has been reviewed, patient examined, no change in status, stable for surgery.  I have reviewed the patient's chart and labs.  Questions were answered to the patient's satisfaction.     [name]

## 2021-03-09 NOTE — Op Note (Signed)
MRN:     [id number] DOB/AGE:    1941/03/09 / 80 y.o.       OPERATIVE REPORT   DATE OF PROCEDURE:  03/09/2021      PREOPERATIVE DIAGNOSIS:   Primary Localized Osteoarthritis left Knee       Estimated body mass index is 29.98 kg/m as calculated from the following:   Height as of this encounter: 5\' 9"  (1.753 m).   Weight as of this encounter: 92.1 kg.                                                       POSTOPERATIVE DIAGNOSIS:   Same                                                                 PROCEDURE:  Procedure(s): TOTAL KNEE ARTHROPLASTY Using Depuy Attune RP implants #5 Femur, #7Tibia, 64mm  RP bearing, 38 Patella    SURGEON: Salmaan Patchin A. 11m, MD   ASSISTANT: Thurston Hole, PA-C, present and scrubbed throughout the case, critical for retraction, instrumentation, and closure.  ANESTHESIA: Spinal with Adductor Nerve Block  TOURNIQUET TIME: 60 minutes   COMPLICATIONS:  None       SPECIMENS: None   INDICATIONS FOR PROCEDURE: The patient has djd of the knee with varus deformities, XR shows bone on bone arthritis. Patient has failed all conservative measures including anti-inflammatory medicines, narcotics, attempts at exercise and weight loss, cortisone injections and viscosupplementation.  Risks and benefits of surgery have been discussed, questions answered.    DESCRIPTION OF PROCEDURE: The patient identified by armband, received right adductor canal block and IV antibiotics, in the holding area at [facility]. Patient taken to the operating room, appropriate anesthetic monitors were attached. Spinal anesthesia induced with the patient in supine position, Foley catheter was inserted. Tourniquet applied high to the operative thigh. Lateral post and foot positioner applied to the table, the lower extremity was then prepped and draped in usual sterile fashion from the ankle to the tourniquet. Time-out procedure was performed. The limb was wrapped with an Esmarch bandage and the  tourniquet inflated to 365 mmHg.   We began the operation by making a 6cm anterior midline incision. Small bleeders in the skin and the subcutaneous tissue identified and cauterized. Transverse retinaculum was incised and reflected medially and a medial parapatellar arthrotomy was accomplished. the patella was everted and theprepatellar fat pad resected. The superficial medial collateral ligament was then elevated from anterior to posterior along the proximal flare of the tibia and anterior half of the menisci resected. The knee was hyperflexed exposing bone on bone arthritis. Peripheral and notch osteophytes as well as the cruciate ligaments were then resected. We continued to work our way around posteriorly along the proximal tibia, and externally rotated the tibia subluxing it out from underneath the femur. A McHale retractor was placed through the notch and a lateral Hohmann retractor placed, and an external tibial guide was placed.  The tibial cutting guide was pinned into place allowing resection of 4 mm of bone medially and about 6 mm of bone laterally because of her varus  deformity.   Satisfied with the tibial resection, we then entered the distal femur 2 mm anterior to the PCL origin with the intramedullary guide rod and applied the distal femoral cutting guide set at 54mm, with 5 degrees of valgus. This was pinned along the epicondylar axis. At this point, the distal femoral cut was accomplished without difficulty. We then sized for a 5 femoral component and pinned the guide in 3 degrees of external rotation.The chamfer cutting guide was pinned into place. The anterior, posterior, and chamfer cuts were accomplished without difficulty followed by the  RP box cutting guide and the box cut. We also removed posterior osteophytes from the posterior femoral condyles. At this time, the knee was brought into full extension. We checked our extension and flexion gaps and found them symmetric at 6.  The patella  thickness measured at 7m m. We set the cutting guide at 15 and removed the posterior patella sized for 38 button and drilled the lollipop. The knee was then once again hyperflexed exposing the proximal tibia. We sized for a # 7 tibial base plate, applied the smokestack and the conical reamer followed by the the Delta fin keel punch. We then hammered into place the  RP trial femoral component, inserted a trial bearing, trial patellar button, and took the knee through range of motion from 0-130 degrees. No thumb pressure was required for patellar tracking.   At this point, all trial components were removed, a double batch of DePuy HV cement  was mixed and applied to all bony metallic mating surfaces. In order, we hammered into place the tibial tray and removed excess cement, the femoral component and removed excess cement, a 6 mm  RP bearing was inserted, and the knee brought to full extension with compression. The patellar button was clamped into place, and excess cement removed. While the cement cured the wound was irrigated out with normal saline solution pulse lavage, and exparel was injected throughout the knee. Ligament stability and patellar tracking were checked and found to be excellent..   The parapatellar arthrotomy was closed with  #1 Vicryl suture. The subcutaneous tissue with 0 and 2-0 undyed Vicryl suture, and 4-0 Monocryl.. A dressing of Aquaseal, 4 x 4, dressing sponges, Webril, and Ace wrap applied. Needle and sponge count were correct times 2.The patient awakened, extubated, and taken to recovery room without difficulty. Vascular status was normal, pulses 2+ and symmetric.    [name] 01/23/2018, 8:56 AM

## 2021-03-09 NOTE — Evaluation (Signed)
Physical Therapy Evaluation Patient Details Name: Charles House MRN: [id number] DOB: 12/25/1940 Today's Date: 03/09/2021   History of Present Illness  Patient is 80 y.o. male s/p Lt TKA on 03/09/21 with PMH significant for HTN, HLD, CAD, Afib, CABG, sinus bradycardia.  Clinical Impression  Charles House is a 80 y.o. male POD 0 s/p Lt TKA. Patient reports independence with mobility at baseline. Patient is now limited by functional impairments (see PT problem list below) and requires min guard/supervision for transfers and gait with RW. Patient was able to ambulate ~80 feet with RW and min guard/superivsion. Patient's fiance present and provided safe guarding during gait with cues from therapist. Patient will benefit from continued skilled PT interventions to address impairments and progress towards PLOF. Patient expressed concerns regarding discharging day of surgery and reports his understanding was that he would recover overnight at hospital and that is what he is most comfortable with. Acute PT will follow to progress mobility and stair training in preparation for safe discharge home.     Follow Up Recommendations Follow surgeon's recommendation for DC plan and follow-up therapies;Outpatient PT    Equipment Recommendations  None recommended by PT    Recommendations for Other Services       Precautions / Restrictions Precautions Precautions: Fall Restrictions Weight Bearing Restrictions: No Other Position/Activity Restrictions: WBAT      Mobility  Bed Mobility Overal bed mobility: Needs Assistance Bed Mobility: Supine to Sit;Sit to Supine     Supine to sit: HOB elevated;Min guard Sit to supine: Min guard;HOB elevated   General bed mobility comments: cues to initiate, no assist needed to move supine<>sit EOB. Pt used belt to bring Lt LE back onto bed.    Transfers Overall transfer level: Needs assistance Equipment used: Rolling walker (2 wheeled) Transfers: Sit to/from  Stand Sit to Stand: Min guard;Supervision         General transfer comment: cues for safety and hand placement with RW, no assist needed for power up and pt steady with standing. cue for safe reach back and to extend Lt knee.  Ambulation/Gait Ambulation/Gait assistance: Min guard;Supervision Gait Distance (Feet): 80 Feet Assistive device: Rolling walker (2 wheeled) Gait Pattern/deviations: Step-to pattern;Decreased stride length;Decreased stance time - left;Decreased weight shift to left Gait velocity: decr   General Gait Details: cues for safe step pattern and proximity to RW, no overt LOB or buckling at Lt knee. pt's signficant other present and provided safe guarding position during gait with supervision from therapist.  Stairs            Wheelchair Mobility    Modified Rankin (Stroke Patients Only)       Balance Overall balance assessment: Needs assistance Sitting-balance support: Feet supported Sitting balance-Leahy Scale: Good     Standing balance support: During functional activity;Bilateral upper extremity supported Standing balance-Leahy Scale: Fair                               Pertinent Vitals/Pain Pain Assessment: 0-10 Pain Score: 6  Pain Location: Lt knee Pain Descriptors / Indicators: Aching;Discomfort Pain Intervention(s): Limited activity within patient's tolerance;Monitored during session;Repositioned    Home Living Family/patient expects to be discharged to:: Private residence Living Arrangements: Spouse/significant other Available Help at Discharge: Family Type of Home: House (Condo) Home Access: Level entry;Ramped entrance     Home Layout: One level Home Equipment: Environmental consultant - 2 wheels;Walker - 4 wheels;Cane - single point;Bedside commode;Grab bars - toilet;Shower  seat - built in Additional Comments: pt's fiance will assist him    Prior Function Level of Independence: Independent               Hand Dominance    Dominant Hand: Right    Extremity/Trunk Assessment   Upper Extremity Assessment Upper Extremity Assessment: Overall WFL for tasks assessed    Lower Extremity Assessment Lower Extremity Assessment: LLE deficits/detail LLE Deficits / Details: good quad activation, no extensor lag with SLR, no buckling with gait LLE Sensation: WNL LLE Coordination: WNL    Cervical / Trunk Assessment Cervical / Trunk Assessment: Normal  Communication   Communication: No difficulties  Cognition Arousal/Alertness: Awake/alert Behavior During Therapy: WFL for tasks assessed/performed Overall Cognitive Status: Within Functional Limits for tasks assessed                                        General Comments      Exercises     Assessment/Plan    PT Assessment Patient needs continued PT services  PT Problem List Decreased strength;Decreased range of motion;Decreased activity tolerance;Decreased balance;Decreased mobility;Decreased knowledge of use of DME;Decreased knowledge of precautions;Pain       PT Treatment Interventions DME instruction;Gait training;Stair training;Functional mobility training;Therapeutic activities;Balance training;Therapeutic exercise;Patient/family education    PT Goals (Current goals can be found in the Care Plan section)  Acute Rehab PT Goals Patient Stated Goal: recover safely and get back to golfing PT Goal Formulation: With patient/family Time For Goal Achievement: 03/16/21 Potential to Achieve Goals: Good    Frequency 7X/week   Barriers to discharge        Co-evaluation               AM-PAC PT "6 Clicks" Mobility  Outcome Measure Help needed turning from your back to your side while in a flat bed without using bedrails?: None Help needed moving from lying on your back to sitting on the side of a flat bed without using bedrails?: A Little Help needed moving to and from a bed to a chair (including a wheelchair)?: A Little Help  needed standing up from a chair using your arms (e.g., wheelchair or bedside chair)?: A Little Help needed to walk in hospital room?: A Little Help needed climbing 3-5 steps with a railing? : A Little 6 Click Score: 19    End of Session Equipment Utilized During Treatment: Gait belt Activity Tolerance: Patient tolerated treatment well Patient left: in bed;with call bell/phone within reach;with family/visitor present Nurse Communication: Mobility status PT Visit Diagnosis: Muscle weakness (generalized) (M62.81);Difficulty in walking, not elsewhere classified (R26.2);Pain Pain - Right/Left: Left Pain - part of body: Knee    Time: 1150-1223 PT Time Calculation (min) (ACUTE ONLY): 33 min   Charges:   PT Evaluation $PT Eval Low Complexity: 1 Low PT Treatments $Gait Training: 8-22 mins        Wynn Maudlin, DPT Acute Rehabilitation Services Office [phone] Pager [phone]    Anitra Lauth 03/09/2021, 12:56 PM

## 2021-03-09 NOTE — Progress Notes (Signed)
Orthopedic Tech Progress Note Patient Details:  Charles House 1941/03/15 [id number]  CPM Left Knee CPM Left Knee: On Left Knee Flexion (Degrees): 90 Left Knee Extension (Degrees): 0 CPM Right Knee CPM Right Knee: Off  Post Interventions Patient Tolerated: Well  Charles House 03/09/2021, 7:24 PM

## 2021-03-09 NOTE — Progress Notes (Signed)
Orthopedic Tech Progress Note Patient Details:  Laiken Nohr [birth year] [id number]  CPM Left Knee Left Knee Flexion (Degrees): 90 Left Knee Extension (Degrees): 0  Post Interventions Patient Tolerated: Well Ortho Devices Type of Ortho Device: Bone foam zero knee Ortho Device/Splint Interventions: Ordered   Post Interventions Patient Tolerated: Well   Genelle Bal Margarett Viti 03/09/2021, 2:32 PM

## 2021-03-09 NOTE — Progress Notes (Signed)
Orthopedic Tech Progress Note Patient Details:  Charles House [birth year] [id number] CPM will be applied at 7pm  CPM Left Knee CPM Left Knee: Off Left Knee Flexion (Degrees): 90 Left Knee Extension (Degrees): 0 CPM Right Knee CPM Right Knee: Off  Post Interventions Patient Tolerated: Well  [name] 03/09/2021, 4:59 PM

## 2021-03-09 NOTE — Anesthesia Postprocedure Evaluation (Signed)
Anesthesia Post Note  Patient: Charles House  Procedure(s) Performed: TOTAL KNEE ARTHROPLASTY (Left Knee)     Patient location during evaluation: PACU Anesthesia Type: Regional and Spinal Level of consciousness: oriented and awake and alert Pain management: pain level controlled Vital Signs Assessment: post-procedure vital signs reviewed and stable Respiratory status: spontaneous breathing and respiratory function stable Cardiovascular status: blood pressure returned to baseline and stable Postop Assessment: no headache, no backache, no apparent nausea or vomiting and patient able to bend at knees Anesthetic complications: no   No complications documented.  Last Vitals:  Vitals:   03/09/21 1015 03/09/21 1107  BP: 108/64 130/70  Pulse: 60 60  Resp: 16 18  Temp:  36.4 C  SpO2: 98% 97%    Last Pain:  Vitals:   03/09/21 1107  TempSrc:   PainSc: 4                  Candra R Leelan Rajewski

## 2021-03-09 NOTE — Transfer of Care (Signed)
Immediate Anesthesia Transfer of Care Note  Patient: Charles House  Procedure(s) Performed: TOTAL KNEE ARTHROPLASTY (Left Knee)  Patient Location: PACU  Anesthesia Type:Spinal  Level of Consciousness: awake  Airway & Oxygen Therapy: Patient Spontanous Breathing and Patient connected to face mask oxygen  Post-op Assessment: Report given to RN and Post -op Vital signs reviewed and stable  Post vital signs: Reviewed and stable  Last Vitals:  Vitals Value Taken Time  BP    Temp    Pulse    Resp    SpO2      Last Pain:  Vitals:   03/09/21 0600  TempSrc: Oral         Complications: No complications documented.

## 2021-03-09 NOTE — Anesthesia Procedure Notes (Signed)
Procedure Name: MAC Date/Time: 03/09/2021 7:17 AM Performed by: Niel Hummer, CRNA Pre-anesthesia Checklist: Patient identified, Suction available, Emergency Drugs available and Patient being monitored Oxygen Delivery Method: Simple face mask

## 2021-03-09 NOTE — Anesthesia Procedure Notes (Signed)
Anesthesia Regional Block: Adductor canal block   Pre-Anesthetic Checklist: ,, timeout performed, Correct Patient, Correct Site, Correct Laterality, Correct Procedure, Correct Position, site marked, Risks and benefits discussed,  Surgical consent,  Pre-op evaluation,  At surgeon's request and post-op pain management  Laterality: Left  Prep: chloraprep       Needles:  Injection technique: Single-shot  Needle Type: Echogenic Stimulator Needle          Additional Needles:   Procedures:,,,, ultrasound used (permanent image in chart),,,,  Narrative:  Start time: 03/09/2021 6:55 AM End time: 03/09/2021 7:00 AM Injection made incrementally with aspirations every 5 mL.  Performed by: Personally  Anesthesiologist: Mellody Dance, MD  Additional Notes: A functioning IV was confirmed and monitors were applied.  Sterile prep and drape, hand hygiene and sterile gloves were used.  Negative aspiration and test dose prior to incremental administration of local anesthetic. The patient tolerated the procedure well.Ultrasound  guidance: relevant anatomy identified, needle position confirmed, local anesthetic spread visualized around nerve(s), vascular puncture avoided.  Image printed for medical record.

## 2021-03-09 NOTE — Anesthesia Procedure Notes (Signed)
Spinal  Patient location during procedure: OR Start time: 03/09/2021 7:18 AM End time: 03/09/2021 7:23 AM Reason for block: surgical anesthesia Staffing Performed: resident/CRNA  Resident/CRNA: Nelle Don, CRNA Preanesthetic Checklist Completed: patient identified, IV checked, risks and benefits discussed, surgical consent, monitors and equipment checked and pre-op evaluation Spinal Block Patient position: sitting Prep: DuraPrep Patient monitoring: heart rate, continuous pulse ox and blood pressure Approach: midline Location: L3-4 Injection technique: single-shot Needle Needle type: Pencan  Needle gauge: 24 G Needle length: 10 cm Additional Notes CSF return present. No complications noted.

## 2021-03-10 ENCOUNTER — Encounter (HOSPITAL_COMMUNITY): Payer: Self-pay | Admitting: Orthopedic Surgery

## 2021-03-10 DIAGNOSIS — M1712 Unilateral primary osteoarthritis, left knee: Secondary | ICD-10-CM | POA: Diagnosis not present

## 2021-03-10 DIAGNOSIS — Z7902 Long term (current) use of antithrombotics/antiplatelets: Secondary | ICD-10-CM | POA: Diagnosis not present

## 2021-03-10 DIAGNOSIS — I480 Paroxysmal atrial fibrillation: Secondary | ICD-10-CM | POA: Diagnosis not present

## 2021-03-10 DIAGNOSIS — Z79899 Other long term (current) drug therapy: Secondary | ICD-10-CM | POA: Diagnosis not present

## 2021-03-10 DIAGNOSIS — Z951 Presence of aortocoronary bypass graft: Secondary | ICD-10-CM | POA: Diagnosis not present

## 2021-03-10 DIAGNOSIS — Z20822 Contact with and (suspected) exposure to covid-19: Secondary | ICD-10-CM | POA: Diagnosis not present

## 2021-03-10 DIAGNOSIS — Z7982 Long term (current) use of aspirin: Secondary | ICD-10-CM | POA: Diagnosis not present

## 2021-03-10 DIAGNOSIS — I10 Essential (primary) hypertension: Secondary | ICD-10-CM | POA: Diagnosis not present

## 2021-03-10 DIAGNOSIS — I2510 Atherosclerotic heart disease of native coronary artery without angina pectoris: Secondary | ICD-10-CM | POA: Diagnosis not present

## 2021-03-10 DIAGNOSIS — Z96652 Presence of left artificial knee joint: Secondary | ICD-10-CM | POA: Diagnosis not present

## 2021-03-10 LAB — BASIC METABOLIC PANEL
Anion gap: 5 (ref 5–15)
BUN: 16 mg/dL (ref 8–23)
CO2: 24 mmol/L (ref 22–32)
Calcium: 8.6 mg/dL — ABNORMAL LOW (ref 8.9–10.3)
Chloride: 107 mmol/L (ref 98–111)
Creatinine, Ser: 0.89 mg/dL (ref 0.61–1.24)
GFR, Estimated: >60 mL/min (ref >60)
Glucose, Bld: 199 mg/dL — ABNORMAL HIGH (ref 70–99)
Potassium: 4 mmol/L (ref 3.5–5.1)
Sodium: 136 mmol/L (ref 135–145)

## 2021-03-10 LAB — CBC
HCT: 32.5 % — ABNORMAL LOW (ref 39.0–52.0)
Hemoglobin: 10.9 g/dL — ABNORMAL LOW (ref 13.0–17.0)
MCH: 31.1 pg (ref 26.0–34.0)
MCHC: 33.5 g/dL (ref 30.0–36.0)
MCV: 92.6 fL (ref 80.0–100.0)
Platelets: 148 10*3/uL — ABNORMAL LOW (ref 150–400)
RBC: 3.51 MIL/uL — ABNORMAL LOW (ref 4.22–5.81)
RDW: 13.2 % (ref 11.5–15.5)
WBC: 12.2 10*3/uL — ABNORMAL HIGH (ref 4.0–10.5)
nRBC: 0 % (ref 0.0–0.2)

## 2021-03-10 NOTE — TOC Transition Note (Signed)
Transition of Care (TOC) - CM/SW Discharge Note  Patient Details  Name: Charles House MRN: [id number] Date of Birth: 10/26/1941  Transition of Care (TOC) CM/SW Contact:  Megan S Glenn, LCSW Phone Number: 03/10/2021, 10:12 AM  Clinical Narrative: Patient expected to discharge home after working with PT. CSW met with patient to confirm discharge plan. Patient will go to OPPT at Dr. Wainer's office. Patient has a 3N1 at home and [employer] delivered a CPM and rolling walker. No other needs identified at this time. TOC signing off.  Final next level of care: OP Rehab Barriers to Discharge: No Barriers Identified  Patient Goals and CMS Choice Patient states their goals for this hospitalization and ongoing recovery are:: Discharge home with OPPT through orthopedist's office CMS Medicare.gov Compare Post Acute Care list provided to:: Patient Choice offered to / list presented to : Patient  Discharge Plan and Services        DME Arranged: CPM,Walker rolling DME Agency: [employer] Representative spoke with at DME Agency: Pre-arranged in orthopedist's office  Readmission Risk Interventions No flowsheet data found.     

## 2021-03-10 NOTE — Discharge Summary (Signed)
Patient ID: Charles House MRN: [id number] DOB/AGE: [birth year] 80 y.o.  Admit date: 03/09/2021 Discharge date: 03/10/2021  Admission Diagnoses:  Principal Problem:   Primary localized osteoarthritis of left knee Active Problems:   Hyperlipidemia   Essential hypertension   PAROXYSMAL ATRIAL FIBRILLATION   CORONARY ARTERY BYPASS GRAFT, HX OF   Hyperglycemia   Coronary artery disease   Discharge Diagnoses:  Same  Past Medical History:  Diagnosis Date  . Atrial fibrillation (HCC)    once in setting of a spider bite 1/10, without recurrence  . Carotid artery occlusion    0-39% stenosis by [place] 4/11  . Coronary artery disease    a. s/p 6V CABG 11/18/08. b. acute inferior STEMI secondary to acute occlusion proximal RCA s/p successful PTCA/DES x 2 proximal and mid RCA, EF 50-55%  . Hyperglycemia   . Hyperlipidemia   . Hypertension   . Pneumonia    hx of   . Primary localized osteoarthritis of left knee   . Sinus bradycardia    a. Not on BB due to this. Previously did not tolerate BB.    Surgeries: Procedure(s): TOTAL KNEE ARTHROPLASTY on 03/09/2021   Consultants:   Discharged Condition: Improved  Hospital Course: Caidan Hubbert is an 80 y.o. male who was admitted 03/09/2021 for operative treatment ofPrimary localized osteoarthritis of left knee. Patient has severe unremitting pain that affects sleep, daily activities, and work/hobbies. After pre-op clearance the patient was taken to the operating room on 03/09/2021 and underwent  Procedure(s): TOTAL KNEE ARTHROPLASTY.    Patient was given perioperative antibiotics:  Anti-infectives (From admission, onward)   Start     Dose/Rate Route Frequency Ordered Stop   03/09/21 1500  ceFAZolin (ANCEF) IVPB 2g/100 mL premix        2 g 200 mL/hr over 30 Minutes Intravenous Every 6 hours 03/09/21 1435 03/09/21 2130   03/09/21 0600  ceFAZolin (ANCEF) IVPB 2g/100 mL premix        2 g 200 mL/hr over 30 Minutes Intravenous On call to O.R. 03/09/21  [id number] 03/09/21 0754       Patient was given sequential compression devices, early ambulation, and chemoprophylaxis to prevent DVT.  Patient was unable to discharge yesterday due to pain control requiring IV medication overnight.  This am, he walked around the whole unit and up and down a flight of stairs without difficulty  Patient benefited maximally from hospital stay and there were no complications.    Recent vital signs:  Patient Vitals for the past 24 hrs:  BP Temp Temp src Pulse Resp SpO2  03/10/21 0402 (!) 110/52 98.2 F (36.8 C) Oral 70 15 92 %  03/09/21 2352 (!) 110/59 98.9 F (37.2 C) Oral 80 18 92 %  03/09/21 1952 121/71 98.7 F (37.1 C) Oral 99 18 93 %  03/09/21 1637 (!) 115/59 98.6 F (37 C) Oral 80 -- 91 %  03/09/21 1440 124/69 98 F (36.7 C) Oral 77 16 --  03/09/21 1410 127/85 -- -- -- 18 --  03/09/21 1300 139/73 -- -- -- -- --  03/09/21 1210 139/76 -- -- -- -- --  03/09/21 1200 134/72 -- -- -- -- --  03/09/21 1107 130/70 97.6 F (36.4 C) -- 60 18 97 %  03/09/21 1100 119/73 (!) 97.5 F (36.4 C) -- (!) 58 14 96 %  03/09/21 1045 118/60 -- -- 61 13 100 %  03/09/21 1030 114/60 -- -- 60 14 98 %  03/09/21 1015 108/64 -- -- 60 16  98 %  03/09/21 1000 106/62 -- -- (!) 56 14 99 %  03/09/21 0945 110/67 -- -- (!) 56 (!) 8 100 %  03/09/21 0930 100/63 -- -- (!) 57 13 100 %  03/09/21 0924 (!) 98/58 (!) 97.5 F (36.4 C) -- 69 (!) 8 100 %     Recent laboratory studies:  Recent Labs    03/10/21 0347  WBC 12.2*  HGB 10.9*  HCT 32.5*  PLT 148*  NA 136  K 4.0  CL 107  CO2 24  BUN 16  CREATININE 0.89  GLUCOSE 199*  CALCIUM 8.6*     Discharge Medications:   Allergies as of 03/10/2021   No Known Allergies     Medication List    TAKE these medications   acetaminophen 500 MG tablet Commonly known as: TYLENOL Take 1,000 mg by mouth every 8 (eight) hours as needed for moderate pain.   aspirin 81 MG tablet Take 325 mg aspirin one a day for 30 days to prevent  blood clots then return to 81 mg daily What changed:   how much to take  how to take this  when to take this  additional instructions   atorvastatin 80 MG tablet Commonly known as: LIPITOR TAKE 1 TABLET (80 MG TOTAL) BY MOUTH DAILY AT 6 PM.   clopidogrel 75 MG tablet Commonly known as: PLAVIX TAKE 1 TABLET EVERY DAY   diclofenac Sodium 1 % Gel Commonly known as: VOLTAREN Apply 1 application topically daily.   docusate sodium 100 MG capsule Commonly known as: Colace 1 tablet twice a day while on narcotics.  STOOL SOFTNER   gabapentin 300 MG capsule Commonly known as: Neurontin 1 tablet at night for nerve pain   nitroGLYCERIN 0.4 MG SL tablet Commonly known as: NITROSTAT Place 1 tablet (0.4 mg total) under the tongue every 5 (five) minutes as needed for chest pain. Please make appt for November. 1st attempt   oxyCODONE 5 MG immediate release tablet Commonly known as: Oxy IR/ROXICODONE Take 1 tablet (5 mg total) by mouth every 4 (four) hours as needed for severe pain.   polyethylene glycol 17 g packet Commonly known as: MiraLax Take 17 g by mouth 2 (two) times daily. 17 grams in 6 oz of favorite drink twice a day until bowel movement.  LAXITIVE.  Restart if two days since last bowel movement   Vitamin D-3 25 MCG (1000 UT) Caps Take 2,000 Units by mouth daily.            Discharge Care Instructions  (From admission, onward)         Start     Ordered   03/09/21 0000  Change dressing       Comments: DO NOT REMOVE BANDAGE OVER SURGICAL INCISION.  WASH WHOLE LEG INCLUDING OVER THE WATERPROOF BANDAGE WITH SOAP AND WATER EVERY DAY.   03/09/21 0728          Diagnostic Studies: No results found.  Disposition: Discharge disposition: 01-Home or Self Care       Discharge Instructions    CPM   Complete by: As directed    Continuous passive motion machine (CPM):      Use the CPM from 0 to 90 for 6 hours per day.       You may break it up into 2 or 3  sessions per day.      Use CPM for 2 weeks or until you are told to stop.   Call MD /  Call 911   Complete by: As directed    If you experience chest pain or shortness of breath, CALL 911 and be transported to the hospital emergency room.  If you develope a fever above 101 F, pus (white drainage) or increased drainage or redness at the wound, or calf pain, call your surgeon's office.   Change dressing   Complete by: As directed    DO NOT REMOVE BANDAGE OVER SURGICAL INCISION.  WASH WHOLE LEG INCLUDING OVER THE WATERPROOF BANDAGE WITH SOAP AND WATER EVERY DAY.   Constipation Prevention   Complete by: As directed    Drink plenty of fluids.  Prune juice may be helpful.  You may use a stool softener, such as Colace (over the counter) 100 mg twice a day.  Use MiraLax (over the counter) for constipation as needed.   Diet - low sodium heart healthy   Complete by: As directed    Discharge instructions   Complete by: As directed    INSTRUCTIONS AFTER JOINT REPLACEMENT   Remove items at home which could result in a fall. This includes throw rugs or furniture in walking pathways You may notice swelling that will progress down to the foot and ankle.  This is normal after surgery.  Elevate your leg when you are not up walking on it.   Continue to use the breathing machine you got in the hospital (incentive spirometer) which will help keep your temperature down.  It is common for your temperature to cycle up and down following surgery, especially at night when you are not up moving around and exerting yourself.  The breathing machine keeps your lungs expanded and your temperature down.   DIET:  As you were doing prior to hospitalization, we recommend a well-balanced diet.  DRESSING / WOUND CARE / SHOWERING  Keep the surgical dressing until follow up.  The dressing is water proof, so you can shower without any extra covering.  IF THE DRESSING FALLS OFF or the wound gets wet inside, change the dressing with  sterile gauze.  Please use good hand washing techniques before changing the dressing.  Do not use any lotions or creams on the incision until instructed by your surgeon.    ACTIVITY  Increase activity slowly as tolerated, but follow the weight bearing instructions below.   No driving for 6 weeks or until further direction given by your physician.  You cannot drive while taking narcotics.  No lifting or carrying greater than 10 lbs. until further directed by your surgeon. Avoid periods of inactivity such as sitting longer than an hour when not asleep. This helps prevent blood clots.  You may return to work once you are authorized by your doctor.     WEIGHT BEARING   Weight bearing as tolerated with assist device (walker, cane, etc) as directed, use it as long as suggested by your surgeon or therapist, typically at least 1-2 WEEKS   EXERCISES  Results after joint replacement surgery are often greatly improved when you follow the exercise, range of motion and muscle strengthening exercises prescribed by your doctor. Safety measures are also important to protect the joint from further injury. Any time any of these exercises cause you to have increased pain or swelling, decrease what you are doing until you are comfortable again and then slowly increase them. If you have problems or questions, call your caregiver or physical therapist for advice.   Rehabilitation is important following a joint replacement. After just a few days of  immobilization, the muscles of the leg can become weakened and shrink (atrophy).  These exercises are designed to build up the tone and strength of the thigh and leg muscles and to improve motion. Often times heat used for twenty to thirty minutes before working out will loosen up your tissues and help with improving the range of motion but do not use heat for the first two weeks following surgery (sometimes heat can increase post-operative swelling).   These exercises  can be done on a training (exercise) mat, on the floor, on a table or on a bed. Use whatever works the best and is most comfortable for you.    Use music or television while you are exercising so that the exercises are a pleasant break in your day. This will make your life better with the exercises acting as a break in your routine that you can look forward to.   Perform all exercises about fifteen times, three times per day or as directed.  You should exercise both the operative leg and the other leg as well.  Exercises include:   Quad Sets - Tighten up the muscle on the front of the thigh (Quad) and hold for 5-10 seconds.   Straight Leg Raises - With your knee straight (if you were given a brace, keep it on), lift the leg to 60 degrees, hold for 3 seconds, and slowly lower the leg.  Perform this exercise against resistance later as your leg gets stronger.  Leg Slides: Lying on your back, slowly slide your foot toward your buttocks, bending your knee up off the floor (only go as far as is comfortable). Then slowly slide your foot back down until your leg is flat on the floor again.  Angel Wings: Lying on your back spread your legs to the side as far apart as you can without causing discomfort.  Hamstring Strength:  Lying on your back, push your heel against the floor with your leg straight by tightening up the muscles of your buttocks.  Repeat, but this time bend your knee to a comfortable angle, and push your heel against the floor.  You may put a pillow under the heel to make it more comfortable if necessary.   A rehabilitation program following joint replacement surgery can speed recovery and prevent re-injury in the future due to weakened muscles. Contact your doctor or a physical therapist for more information on knee rehabilitation.    CONSTIPATION  Constipation is defined medically as fewer than three stools per week and severe constipation as less than one stool per week.  Even if you have a  regular bowel pattern at home, your normal regimen is likely to be disrupted due to multiple reasons following surgery.  Combination of anesthesia, postoperative narcotics, change in appetite and fluid intake all can affect your bowels.   YOU MUST use at least one of the following options; they are listed in order of increasing strength to get the job done.  They are all available over the counter, and you may need to use some, POSSIBLY even all of these options:    Drink plenty of fluids (prune juice may be helpful) and high fiber foods Colace 100 mg by mouth twice a day  Senokot for constipation as directed and as needed Dulcolax (bisacodyl), take with full glass of water  Miralax (polyethylene glycol) once or twice a day as needed.  If you have tried all these things and are unable to have a bowel movement in  the first 3-4 days after surgery call either your surgeon or your primary doctor.    If you experience loose stools or diarrhea, hold the medications until you stool forms back up.  If your symptoms do not get better within 1 week or if they get worse, check with your doctor.  If you experience "the worst abdominal pain ever" or develop nausea or vomiting, please contact the office immediately for further recommendations for treatment.   ITCHING:  If you experience itching with your medications, try taking only a single pain pill, or even half a pain pill at a time.  You can also use Benadryl over the counter for itching or also to help with sleep.   TED HOSE STOCKINGS:  Use stockings on both legs until for at least 2 weeks or as directed by physician office. They may be removed at night for sleeping.  MEDICATIONS:  See your medication summary on the "After Visit Summary" that nursing will review with you.  You may have some home medications which will be placed on hold until you complete the course of blood thinner medication.  It is important for you to complete the blood thinner  medication as prescribed.  PRECAUTIONS:  If you experience chest pain or shortness of breath - call 911 immediately for transfer to the hospital emergency department.   If you develop a fever greater that 101 F, purulent drainage from wound, increased redness or drainage from wound, foul odor from the wound/dressing, or calf pain - CONTACT YOUR SURGEON.                                                   FOLLOW-UP APPOINTMENTS:  If you do not already have a post-op appointment, please call the office for an appointment to be seen by your surgeon.  Guidelines for how soon to be seen are listed in your "After Visit Summary", but are typically between 1-4 weeks after surgery.  OTHER INSTRUCTIONS:   Knee Replacement:  Do not place pillow under knee, focus on keeping the knee straight while resting. CPM instructions: 0-90 degrees, 2 hours in the morning, 2 hours in the afternoon, and 2 hours in the evening. Place foam block, curve side up under heel at all times except when in CPM or when walking.  DO NOT modify, tear, cut, or change the foam block in any way.  POST-OPERATIVE OPIOID TAPER INSTRUCTIONS: It is important to wean off of your opioid medication as soon as possible. If you do not need pain medication after your surgery it is ok to stop day one. Opioids include: Codeine, Hydrocodone(Norco, Vicodin), Oxycodone(Percocet, oxycontin) and hydromorphone amongst others.  Long term and even short term use of opiods can cause: Increased pain response Dependence Constipation Depression Respiratory depression And more.  Withdrawal symptoms can include Flu like symptoms Nausea, vomiting And more Techniques to manage these symptoms Hydrate well Eat regular healthy meals Stay active Use relaxation techniques(deep breathing, meditating, yoga) Do Not substitute Alcohol to help with tapering If you have been on opioids for less than two weeks and do not have pain than it is ok to stop all together.   Plan to wean off of opioids This plan should start within one week post op of your joint replacement. Maintain the same interval or time between taking each dose and first  decrease the dose.  Cut the total daily intake of opioids by one tablet each day Next start to increase the time between doses. The last dose that should be eliminated is the evening dose.     MAKE SURE YOU:  Understand these instructions.  Get help right away if you are not doing well or get worse.    Thank you for letting us be a part of your medical care team.  It is a privilege we respect greatly.  We hope these instructions will help you stay on track for a fast and full recovery!   Discharge instructions   Complete by: As directed    INSTRUCTIONS AFTER JOINT REPLACEMENT   Post op medication Oxycodone 5 mg 1 tab every 4 hrs as needed for pain Gabapentin  1 tab at night for nerve pain Miralax mix 17gms with 6 ounces of something to drink.  Drink twice a day until bowel movements are regular Colace (docusate sodium)  1 tablet twice a day while on narcotics    Stool softener. Aspiring 325 mg a day for 30 days with plavix to prevent blood clots.  After 30 days return to regular dosage of 81 mg daily.    Remove items at home which could result in a fall. This includes throw rugs or furniture in walking pathways You may notice swelling that will progress down to the foot and ankle.  This is normal after surgery.  Elevate your leg when you are not up walking on it.   Continue to use the breathing machine you got in the hospital (incentive spirometer) which will help keep your temperature down.  It is common for your temperature to cycle up and down following surgery, especially at night when you are not up moving around and exerting yourself.  The breathing machine keeps your lungs expanded and your temperature down.   DIET:  As you were doing prior to hospitalization, we recommend a well-balanced  diet.  DRESSING / WOUND CARE / SHOWERING  Keep the surgical dressing until follow up.  The dressing is water proof, so you can shower without any extra covering.  IF THE DRESSING FALLS OFF or the wound gets wet inside, change the dressing with sterile gauze.  Please use good hand washing techniques before changing the dressing.  Do not use any lotions or creams on the incision until instructed by your surgeon.    ACTIVITY  Increase activity slowly as tolerated, but follow the weight bearing instructions below.   No driving for 6 weeks or until further direction given by your physician.  You cannot drive while taking narcotics.  No lifting or carrying greater than 10 lbs. until further directed by your surgeon. Avoid periods of inactivity such as sitting longer than an hour when not asleep. This helps prevent blood clots.  You may return to work once you are authorized by your doctor.     WEIGHT BEARING   Weight bearing as tolerated with assist device (walker, cane, etc) as directed, use it as long as suggested by your surgeon or therapist, typically at least 1-2 weeks.   EXERCISES  Results after joint replacement surgery are often greatly improved when you follow the exercise, range of motion and muscle strengthening exercises prescribed by your doctor. Safety measures are also important to protect the joint from further injury. Any time any of these exercises cause you to have increased pain or swelling, decrease what you are doing until you are comfortable again and  then slowly increase them. If you have problems or questions, call your caregiver or physical therapist for advice.   Rehabilitation is important following a joint replacement. After just a few days of immobilization, the muscles of the leg can become weakened and shrink (atrophy).  These exercises are designed to build up the tone and strength of the thigh and leg muscles and to improve motion. Often times heat used for twenty  to thirty minutes before working out will loosen up your tissues and help with improving the range of motion but do not use heat for the first two weeks following surgery (sometimes heat can increase post-operative swelling).   These exercises can be done on a training (exercise) mat, on the floor, on a table or on a bed. Use whatever works the best and is most comfortable for you.    Use music or television while you are exercising so that the exercises are a pleasant break in your day. This will make your life better with the exercises acting as a break in your routine that you can look forward to.   Perform all exercises about fifteen times, three times per day or as directed.  You should exercise both the operative leg and the other leg as well.  Exercises include:   Quad Sets - Tighten up the muscle on the front of the thigh (Quad) and hold for 5-10 seconds.   Straight Leg Raises - With your knee straight (if you were given a brace, keep it on), lift the leg to 60 degrees, hold for 3 seconds, and slowly lower the leg.  Perform this exercise against resistance later as your leg gets stronger.  Leg Slides: Lying on your back, slowly slide your foot toward your buttocks, bending your knee up off the floor (only go as far as is comfortable). Then slowly slide your foot back down until your leg is flat on the floor again.  Angel Wings: Lying on your back spread your legs to the side as far apart as you can without causing discomfort.  Hamstring Strength:  Lying on your back, push your heel against the floor with your leg straight by tightening up the muscles of your buttocks.  Repeat, but this time bend your knee to a comfortable angle, and push your heel against the floor.  You may put a pillow under the heel to make it more comfortable if necessary.   A rehabilitation program following joint replacement surgery can speed recovery and prevent re-injury in the future due to weakened muscles. Contact your  doctor or a physical therapist for more information on knee rehabilitation.    CONSTIPATION  Constipation is defined medically as fewer than three stools per week and severe constipation as less than one stool per week.  Even if you have a regular bowel pattern at home, your normal regimen is likely to be disrupted due to multiple reasons following surgery.  Combination of anesthesia, postoperative narcotics, change in appetite and fluid intake all can affect your bowels.   YOU MUST use at least one of the following options; they are listed in order of increasing strength to get the job done.  They are all available over the counter, and you may need to use some, POSSIBLY even all of these options:    Drink plenty of fluids (prune juice may be helpful) and high fiber foods Colace 100 mg by mouth twice a day  Senokot for constipation as directed and as needed Dulcolax (bisacodyl), take  with full glass of water  Miralax (polyethylene glycol) once or twice a day as needed.  If you have tried all these things and are unable to have a bowel movement in the first 3-4 days after surgery call either your surgeon or your primary doctor.    If you experience loose stools or diarrhea, hold the medications until you stool forms back up.  If your symptoms do not get better within 1 week or if they get worse, check with your doctor.  If you experience "the worst abdominal pain ever" or develop nausea or vomiting, please contact the office immediately for further recommendations for treatment.   ITCHING:  If you experience itching with your medications, try taking only a single pain pill, or even half a pain pill at a time.  You can also use Benadryl over the counter for itching or also to help with sleep.   TED HOSE STOCKINGS:  Use stockings on both legs until for at least 2 weeks or as directed by physician office. They may be removed at night for sleeping.  MEDICATIONS:  See your medication summary on the  "After Visit Summary" that nursing will review with you.  You may have some home medications which will be placed on hold until you complete the course of blood thinner medication.  It is important for you to complete the blood thinner medication as prescribed.  PRECAUTIONS:  If you experience chest pain or shortness of breath - call 911 immediately for transfer to the hospital emergency department.   If you develop a fever greater that 101 F, purulent drainage from wound, increased redness or drainage from wound, foul odor from the wound/dressing, or calf pain - CONTACT YOUR SURGEON.                                                   FOLLOW-UP APPOINTMENTS:  If you do not already have a post-op appointment, please call the office for an appointment to be seen by your surgeon.  Guidelines for how soon to be seen are listed in your "After Visit Summary", but are typically between 1-4 weeks after surgery.  OTHER INSTRUCTIONS:   Knee Replacement:  Do not place pillow under knee, focus on keeping the knee straight while resting. CPM instructions: 0-90 degrees, 2 hours in the morning, 2 hours in the afternoon, and 2 hours in the evening. Place foam block, curve side up under heel at all times except when in CPM or when walking.  DO NOT modify, tear, cut, or change the foam block in any way.  POST-OPERATIVE OPIOID TAPER INSTRUCTIONS: It is important to wean off of your opioid medication as soon as possible. If you do not need pain medication after your surgery it is ok to stop day one. Opioids include: Codeine, Hydrocodone(Norco, Vicodin), Oxycodone(Percocet, oxycontin) and hydromorphone amongst others.  Long term and even short term use of opiods can cause: Increased pain response Dependence Constipation Depression Respiratory depression And more.  Withdrawal symptoms can include Flu like symptoms Nausea, vomiting And more Techniques to manage these symptoms Hydrate well Eat regular healthy  meals Stay active Use relaxation techniques(deep breathing, meditating, yoga) Do Not substitute Alcohol to help with tapering If you have been on opioids for less than two weeks and do not have pain than it is ok to stop all  together.  Plan to wean off of opioids This plan should start within one week post op of your joint replacement. Maintain the same interval or time between taking each dose and first decrease the dose.  Cut the total daily intake of opioids by one tablet each day Next start to increase the time between doses. The last dose that should be eliminated is the evening dose.     MAKE SURE YOU:  Understand these instructions.  Get help right away if you are not doing well or get worse.    Thank you for letting us be a part of your medical care team.  It is a privilege we respect greatly.  We hope these instructions will help you stay on track for a fast and full recovery!   Do not put a pillow under the knee. Place it under the heel.   Complete by: As directed    Place gray foam block, curve side up under heel at all times except when in CPM or when walking.  DO NOT modify, tear, cut, or change in any way the gray foam block.   Increase activity slowly as tolerated   Complete by: As directed    Patient may shower   Complete by: As directed    Aquacel dressing is water proof    Wash over it and the whole leg with soap and water at the end of your shower   Post-operative opioid taper instructions:   Complete by: As directed    POST-OPERATIVE OPIOID TAPER INSTRUCTIONS: It is important to wean off of your opioid medication as soon as possible. If you do not need pain medication after your surgery it is ok to stop day one. Opioids include: Codeine, Hydrocodone(Norco, Vicodin), Oxycodone(Percocet, oxycontin) and hydromorphone amongst others.  Long term and even short term use of opiods can cause: Increased pain response Dependence Constipation Depression Respiratory  depression And more.  Withdrawal symptoms can include Flu like symptoms Nausea, vomiting And more Techniques to manage these symptoms Hydrate well Eat regular healthy meals Stay active Use relaxation techniques(deep breathing, meditating, yoga) Do Not substitute Alcohol to help with tapering If you have been on opioids for less than two weeks and do not have pain than it is ok to stop all together.  Plan to wean off of opioids This plan should start within one week post op of your joint replacement. Maintain the same interval or time between taking each dose and first decrease the dose.  Cut the total daily intake of opioids by one tablet each day Next start to increase the time between doses. The last dose that should be eliminated is the evening dose.      TED hose   Complete by: As directed    Use stockings (TED hose) for 30 DAYS ON THE OPERATIVE LEG AND 2 weeks on other leg(s).  You may remove them at night for sleeping.       Follow-up Information    Salvatore MarvelWainer, Robert, MD. Go on 03/23/2021.   Specialty: Orthopedic Surgery Why: Your appointment has been scheduled for 2:45.  Contact information: [address] [place] [address] [phone]        [facility] [place] Go on 03/11/2021.   Why: Your appointment has been scheduled for 9:00. Please arrive by 8:30 for paperwork  Contact information: [facility] [address] [place] [address] [phone]  Signed: Pascal Lux 03/10/2021, 8:19 AM

## 2021-03-10 NOTE — Progress Notes (Signed)
Physical Therapy Treatment Patient Details Name: Charles House MRN: [id number] DOB: 11/18/1940 Today's Date: 03/10/2021    History of Present Illness Patient is 80 y.o. male s/p Lt TKA on 03/09/21 with PMH significant for HTN, HLD, CAD, Afib, CABG, sinus bradycardia.    PT Comments    Pt ambulated 100' with RW with 8/10 L knee pain. Reviewed TKA HEP, pt demonstrates good understanding. He is ready to DC home from PT standpoint.   Follow Up Recommendations  Follow surgeon's recommendation for DC plan and follow-up therapies;Outpatient PT     Equipment Recommendations  None recommended by PT    Recommendations for Other Services       Precautions / Restrictions Precautions Precautions: Fall; knee, reviewed no pillow under knee Restrictions Weight Bearing Restrictions: No Other Position/Activity Restrictions: WBAT    Mobility  Bed Mobility Overal bed mobility: Needs Assistance Bed Mobility: Supine to Sit;Sit to Supine     Supine to sit: Modified independent (Device/Increase time);HOB elevated     General bed mobility comments: used rail supine to sit, instructed pt to self assist LLE with RLE for sit to supine    Transfers Overall transfer level: Needs assistance Equipment used: Rolling walker (2 wheeled) Transfers: Sit to/from Stand Sit to Stand: Supervision         General transfer comment: VCs for safe hand placement, no assist to power up  Ambulation/Gait Ambulation/Gait assistance: Modified independent (Device/Increase time) Gait Distance (Feet): 100 Feet Assistive device: Rolling walker (2 wheeled)   Gait velocity: decr   General Gait Details: steady, no loss of balance, 8/10 L knee pain with walking. Pt walked 400' with PA earlier this morning.   Stairs Stairs:  (pt did stair training this morning with ortho PA)           Wheelchair Mobility    Modified Rankin (Stroke Patients Only)       Balance Overall balance assessment: Needs  assistance Sitting-balance support: Feet supported Sitting balance-Leahy Scale: Good     Standing balance support: During functional activity;Bilateral upper extremity supported Standing balance-Leahy Scale: Fair                              Cognition Arousal/Alertness: Awake/alert Behavior During Therapy: WFL for tasks assessed/performed Overall Cognitive Status: Within Functional Limits for tasks assessed                                        Exercises Total Joint Exercises Ankle Circles/Pumps: AROM;Both;20 reps;Supine Quad Sets: AROM;Both;5 reps;Supine Short Arc Quad: AROM;Left;10 reps;Supine Heel Slides: AAROM;Left;10 reps;Supine Hip ABduction/ADduction: AROM;Left;10 reps;Supine Straight Leg Raises: AROM;Left;5 reps;Supine Long Arc Quad: AROM;Left;10 reps;Seated Knee Flexion: AAROM;Left;10 reps;Seated Goniometric ROM: 5-60* AAROM L knee    General Comments        Pertinent Vitals/Pain Pain Score: 8  Pain Location: Lt knee walking Pain Descriptors / Indicators: Sore Pain Intervention(s): Limited activity within patient's tolerance;Monitored during session;Premedicated before session    Home Living                      Prior Function            PT Goals (current goals can now be found in the care plan section) Acute Rehab PT Goals Patient Stated Goal: recover safely and get back to golfing PT Goal Formulation: With patient/family  Time For Goal Achievement: 03/16/21 Potential to Achieve Goals: Good Progress towards PT goals: Progressing toward goals    Frequency    7X/week      PT Plan Current plan remains appropriate    Co-evaluation              AM-PAC PT "6 Clicks" Mobility   Outcome Measure  Help needed turning from your back to your side while in a flat bed without using bedrails?: None Help needed moving from lying on your back to sitting on the side of a flat bed without using bedrails?: A  Little Help needed moving to and from a bed to a chair (including a wheelchair)?: None Help needed standing up from a chair using your arms (e.g., wheelchair or bedside chair)?: None Help needed to walk in hospital room?: None Help needed climbing 3-5 steps with a railing? : A Little 6 Click Score: 22    End of Session Equipment Utilized During Treatment: Gait belt Activity Tolerance: Patient tolerated treatment well Patient left: in bed;with call bell/phone within reach Nurse Communication: Mobility status PT Visit Diagnosis: Muscle weakness (generalized) (M62.81);Difficulty in walking, not elsewhere classified (R26.2);Pain Pain - Right/Left: Left Pain - part of body: Knee     Time: 7544-9201 PT Time Calculation (min) (ACUTE ONLY): 36 min  Charges:  $Gait Training: 8-22 mins $Therapeutic Exercise: 8-22 mins                     Ralene Bathe Kistler PT 03/10/2021  Acute Rehabilitation Services Pager [phone] Office [phone]

## 2021-03-10 NOTE — Plan of Care (Signed)
  Problem: Pain Management: Goal: Pain level will decrease with appropriate interventions Outcome: Progressing   

## 2021-03-11 DIAGNOSIS — M6281 Muscle weakness (generalized): Secondary | ICD-10-CM | POA: Diagnosis not present

## 2021-03-11 DIAGNOSIS — R262 Difficulty in walking, not elsewhere classified: Secondary | ICD-10-CM | POA: Diagnosis not present

## 2021-03-11 DIAGNOSIS — M25462 Effusion, left knee: Secondary | ICD-10-CM | POA: Diagnosis not present

## 2021-03-11 DIAGNOSIS — M25662 Stiffness of left knee, not elsewhere classified: Secondary | ICD-10-CM | POA: Diagnosis not present

## 2021-03-13 DIAGNOSIS — M25462 Effusion, left knee: Secondary | ICD-10-CM | POA: Diagnosis not present

## 2021-03-13 DIAGNOSIS — M6281 Muscle weakness (generalized): Secondary | ICD-10-CM | POA: Diagnosis not present

## 2021-03-13 DIAGNOSIS — R262 Difficulty in walking, not elsewhere classified: Secondary | ICD-10-CM | POA: Diagnosis not present

## 2021-03-13 DIAGNOSIS — M25662 Stiffness of left knee, not elsewhere classified: Secondary | ICD-10-CM | POA: Diagnosis not present

## 2021-03-16 DIAGNOSIS — M25462 Effusion, left knee: Secondary | ICD-10-CM | POA: Diagnosis not present

## 2021-03-16 DIAGNOSIS — R262 Difficulty in walking, not elsewhere classified: Secondary | ICD-10-CM | POA: Diagnosis not present

## 2021-03-16 DIAGNOSIS — M6281 Muscle weakness (generalized): Secondary | ICD-10-CM | POA: Diagnosis not present

## 2021-03-16 DIAGNOSIS — M25662 Stiffness of left knee, not elsewhere classified: Secondary | ICD-10-CM | POA: Diagnosis not present

## 2021-03-19 DIAGNOSIS — M25562 Pain in left knee: Secondary | ICD-10-CM | POA: Diagnosis not present

## 2021-03-20 DIAGNOSIS — M6281 Muscle weakness (generalized): Secondary | ICD-10-CM | POA: Diagnosis not present

## 2021-03-20 DIAGNOSIS — M25462 Effusion, left knee: Secondary | ICD-10-CM | POA: Diagnosis not present

## 2021-03-20 DIAGNOSIS — M25662 Stiffness of left knee, not elsewhere classified: Secondary | ICD-10-CM | POA: Diagnosis not present

## 2021-03-20 DIAGNOSIS — R262 Difficulty in walking, not elsewhere classified: Secondary | ICD-10-CM | POA: Diagnosis not present

## 2021-03-23 DIAGNOSIS — R262 Difficulty in walking, not elsewhere classified: Secondary | ICD-10-CM | POA: Diagnosis not present

## 2021-03-23 DIAGNOSIS — M6281 Muscle weakness (generalized): Secondary | ICD-10-CM | POA: Diagnosis not present

## 2021-03-23 DIAGNOSIS — M25462 Effusion, left knee: Secondary | ICD-10-CM | POA: Diagnosis not present

## 2021-03-23 DIAGNOSIS — M25662 Stiffness of left knee, not elsewhere classified: Secondary | ICD-10-CM | POA: Diagnosis not present

## 2021-03-25 DIAGNOSIS — M25662 Stiffness of left knee, not elsewhere classified: Secondary | ICD-10-CM | POA: Diagnosis not present

## 2021-03-25 DIAGNOSIS — R262 Difficulty in walking, not elsewhere classified: Secondary | ICD-10-CM | POA: Diagnosis not present

## 2021-03-25 DIAGNOSIS — M25462 Effusion, left knee: Secondary | ICD-10-CM | POA: Diagnosis not present

## 2021-03-25 DIAGNOSIS — M6281 Muscle weakness (generalized): Secondary | ICD-10-CM | POA: Diagnosis not present

## 2021-03-27 DIAGNOSIS — R262 Difficulty in walking, not elsewhere classified: Secondary | ICD-10-CM | POA: Diagnosis not present

## 2021-03-27 DIAGNOSIS — M25662 Stiffness of left knee, not elsewhere classified: Secondary | ICD-10-CM | POA: Diagnosis not present

## 2021-03-27 DIAGNOSIS — M6281 Muscle weakness (generalized): Secondary | ICD-10-CM | POA: Diagnosis not present

## 2021-03-27 DIAGNOSIS — M25462 Effusion, left knee: Secondary | ICD-10-CM | POA: Diagnosis not present

## 2021-04-01 DIAGNOSIS — M25462 Effusion, left knee: Secondary | ICD-10-CM | POA: Diagnosis not present

## 2021-04-01 DIAGNOSIS — M6281 Muscle weakness (generalized): Secondary | ICD-10-CM | POA: Diagnosis not present

## 2021-04-01 DIAGNOSIS — R262 Difficulty in walking, not elsewhere classified: Secondary | ICD-10-CM | POA: Diagnosis not present

## 2021-04-01 DIAGNOSIS — M25662 Stiffness of left knee, not elsewhere classified: Secondary | ICD-10-CM | POA: Diagnosis not present

## 2021-04-03 DIAGNOSIS — M6281 Muscle weakness (generalized): Secondary | ICD-10-CM | POA: Diagnosis not present

## 2021-04-03 DIAGNOSIS — M25662 Stiffness of left knee, not elsewhere classified: Secondary | ICD-10-CM | POA: Diagnosis not present

## 2021-04-03 DIAGNOSIS — R262 Difficulty in walking, not elsewhere classified: Secondary | ICD-10-CM | POA: Diagnosis not present

## 2021-04-03 DIAGNOSIS — M25462 Effusion, left knee: Secondary | ICD-10-CM | POA: Diagnosis not present

## 2021-04-06 DIAGNOSIS — R262 Difficulty in walking, not elsewhere classified: Secondary | ICD-10-CM | POA: Diagnosis not present

## 2021-04-06 DIAGNOSIS — M6281 Muscle weakness (generalized): Secondary | ICD-10-CM | POA: Diagnosis not present

## 2021-04-06 DIAGNOSIS — M25662 Stiffness of left knee, not elsewhere classified: Secondary | ICD-10-CM | POA: Diagnosis not present

## 2021-04-06 DIAGNOSIS — M25462 Effusion, left knee: Secondary | ICD-10-CM | POA: Diagnosis not present

## 2021-04-08 DIAGNOSIS — R262 Difficulty in walking, not elsewhere classified: Secondary | ICD-10-CM | POA: Diagnosis not present

## 2021-04-08 DIAGNOSIS — M6281 Muscle weakness (generalized): Secondary | ICD-10-CM | POA: Diagnosis not present

## 2021-04-08 DIAGNOSIS — M25462 Effusion, left knee: Secondary | ICD-10-CM | POA: Diagnosis not present

## 2021-04-08 DIAGNOSIS — M25662 Stiffness of left knee, not elsewhere classified: Secondary | ICD-10-CM | POA: Diagnosis not present

## 2021-04-10 DIAGNOSIS — R262 Difficulty in walking, not elsewhere classified: Secondary | ICD-10-CM | POA: Diagnosis not present

## 2021-04-10 DIAGNOSIS — M6281 Muscle weakness (generalized): Secondary | ICD-10-CM | POA: Diagnosis not present

## 2021-04-10 DIAGNOSIS — M25462 Effusion, left knee: Secondary | ICD-10-CM | POA: Diagnosis not present

## 2021-04-10 DIAGNOSIS — M25662 Stiffness of left knee, not elsewhere classified: Secondary | ICD-10-CM | POA: Diagnosis not present

## 2021-04-13 DIAGNOSIS — M6281 Muscle weakness (generalized): Secondary | ICD-10-CM | POA: Diagnosis not present

## 2021-04-13 DIAGNOSIS — M25562 Pain in left knee: Secondary | ICD-10-CM | POA: Diagnosis not present

## 2021-04-13 DIAGNOSIS — M25662 Stiffness of left knee, not elsewhere classified: Secondary | ICD-10-CM | POA: Diagnosis not present

## 2021-04-13 DIAGNOSIS — R262 Difficulty in walking, not elsewhere classified: Secondary | ICD-10-CM | POA: Diagnosis not present

## 2021-04-23 DIAGNOSIS — M6281 Muscle weakness (generalized): Secondary | ICD-10-CM | POA: Diagnosis not present

## 2021-04-23 DIAGNOSIS — M25462 Effusion, left knee: Secondary | ICD-10-CM | POA: Diagnosis not present

## 2021-04-23 DIAGNOSIS — M25662 Stiffness of left knee, not elsewhere classified: Secondary | ICD-10-CM | POA: Diagnosis not present

## 2021-04-23 DIAGNOSIS — R262 Difficulty in walking, not elsewhere classified: Secondary | ICD-10-CM | POA: Diagnosis not present

## 2021-05-07 DIAGNOSIS — M6281 Muscle weakness (generalized): Secondary | ICD-10-CM | POA: Diagnosis not present

## 2021-05-07 DIAGNOSIS — M25462 Effusion, left knee: Secondary | ICD-10-CM | POA: Diagnosis not present

## 2021-05-07 DIAGNOSIS — R262 Difficulty in walking, not elsewhere classified: Secondary | ICD-10-CM | POA: Diagnosis not present

## 2021-05-07 DIAGNOSIS — M25662 Stiffness of left knee, not elsewhere classified: Secondary | ICD-10-CM | POA: Diagnosis not present

## 2021-05-11 DIAGNOSIS — M25462 Effusion, left knee: Secondary | ICD-10-CM | POA: Diagnosis not present

## 2021-05-14 DIAGNOSIS — R262 Difficulty in walking, not elsewhere classified: Secondary | ICD-10-CM | POA: Diagnosis not present

## 2021-05-14 DIAGNOSIS — M25462 Effusion, left knee: Secondary | ICD-10-CM | POA: Diagnosis not present

## 2021-05-14 DIAGNOSIS — M6281 Muscle weakness (generalized): Secondary | ICD-10-CM | POA: Diagnosis not present

## 2021-05-14 DIAGNOSIS — M25662 Stiffness of left knee, not elsewhere classified: Secondary | ICD-10-CM | POA: Diagnosis not present

## 2021-06-18 ENCOUNTER — Telehealth: Payer: Self-pay | Admitting: *Deleted

## 2021-06-18 DIAGNOSIS — M25462 Effusion, left knee: Secondary | ICD-10-CM | POA: Diagnosis not present

## 2021-06-18 NOTE — Telephone Encounter (Signed)
   Name: Charles House  DOB: January 09, 1941  MRN: [id number]   Primary Cardiologist: Dr [name]  Chart reviewed as part of pre-operative protocol coverage. Patient was contacted 06/18/2021 in reference to pre-operative risk assessment for pending surgery as outlined below.  Charles House was last seen on 08/20/20 by Dr. [name].  Since that day, Charles House has done well.  He has a history of CABG   Previously cleared to hold plavix for knee replacement. He has had no interval change in his cardiac history. He may hold plavix for 5-7 prior to surgery. We prefer he continue ASA without interruption.   Therefore, based on ACC/AHA guidelines, the patient would be at acceptable risk for the planned procedure without further cardiovascular testing.   The patient was advised that if he develops new symptoms prior to surgery to contact our office to arrange for a follow-up visit, and he verbalized understanding.  I will route this recommendation to the requesting party via Epic fax function and remove from pre-op pool. Please call with questions.  Roe Rutherford Casanova Schurman, PA 06/18/2021, 5:11 PM

## 2021-06-18 NOTE — Telephone Encounter (Signed)
   [facility] Pre-operative Risk Assessment    Patient Name: Charles House  DOB: [birth year] MRN: [id number]  HEARTCARE STAFF:  - IMPORTANT!!!!!! Under Visit Info/Reason for Call, type in Other and utilize the format Clearance MM/DD/YY or Clearance TBD. Do not use dashes or single digits. - Please review there is not already an duplicate clearance open for this procedure. - If request is for dental extraction, please clarify the # of teeth to be extracted. - If the patient is currently at the dentist's office, call Pre-Op Callback Staff (MA/nurse) to input urgent request.  - If the patient is not currently in the dentist office, please route to the Pre-Op pool.  Request for surgical clearance:  What type of surgery is being performed?  LEFT KNEE MANIPULATION  When is this surgery scheduled?  07/08/21  What type of clearance is required (medical clearance vs. Pharmacy clearance to hold med vs. Both)?  BOTH  Are there any medications that need to be held prior to surgery and how long?  ASPIRIN / [place]  Practice name and name of physician performing surgery?  [facility] / DR. Noemi Chapel  What is the office phone number?  [phone]   7.   What is the office fax number?  [phone] ATTN:  SHERRI  8.   Anesthesia type (None, local, MAC, general) ?     Jeanann Lewandowsky 06/18/2021, 2:36 PM  _________________________________________________________________   (provider comments below)

## 2021-07-20 ENCOUNTER — Other Ambulatory Visit: Payer: Self-pay | Admitting: Internal Medicine

## 2021-08-12 DIAGNOSIS — Z96652 Presence of left artificial knee joint: Secondary | ICD-10-CM | POA: Diagnosis not present

## 2021-08-12 DIAGNOSIS — G8918 Other acute postprocedural pain: Secondary | ICD-10-CM | POA: Diagnosis not present

## 2021-08-12 DIAGNOSIS — M24662 Ankylosis, left knee: Secondary | ICD-10-CM | POA: Diagnosis not present

## 2021-08-12 DIAGNOSIS — M1712 Unilateral primary osteoarthritis, left knee: Secondary | ICD-10-CM | POA: Diagnosis not present

## 2021-08-12 DIAGNOSIS — T8482XA Fibrosis due to internal orthopedic prosthetic devices, implants and grafts, initial encounter: Secondary | ICD-10-CM | POA: Diagnosis not present

## 2021-08-13 DIAGNOSIS — R262 Difficulty in walking, not elsewhere classified: Secondary | ICD-10-CM | POA: Diagnosis not present

## 2021-08-13 DIAGNOSIS — M6281 Muscle weakness (generalized): Secondary | ICD-10-CM | POA: Diagnosis not present

## 2021-08-13 DIAGNOSIS — M25662 Stiffness of left knee, not elsewhere classified: Secondary | ICD-10-CM | POA: Diagnosis not present

## 2021-08-13 DIAGNOSIS — M25462 Effusion, left knee: Secondary | ICD-10-CM | POA: Diagnosis not present

## 2021-08-14 DIAGNOSIS — R262 Difficulty in walking, not elsewhere classified: Secondary | ICD-10-CM | POA: Diagnosis not present

## 2021-08-14 DIAGNOSIS — M25462 Effusion, left knee: Secondary | ICD-10-CM | POA: Diagnosis not present

## 2021-08-14 DIAGNOSIS — M25662 Stiffness of left knee, not elsewhere classified: Secondary | ICD-10-CM | POA: Diagnosis not present

## 2021-08-14 DIAGNOSIS — M6281 Muscle weakness (generalized): Secondary | ICD-10-CM | POA: Diagnosis not present

## 2021-08-17 DIAGNOSIS — M25462 Effusion, left knee: Secondary | ICD-10-CM | POA: Diagnosis not present

## 2021-08-17 DIAGNOSIS — M25662 Stiffness of left knee, not elsewhere classified: Secondary | ICD-10-CM | POA: Diagnosis not present

## 2021-08-17 DIAGNOSIS — M6281 Muscle weakness (generalized): Secondary | ICD-10-CM | POA: Diagnosis not present

## 2021-08-17 DIAGNOSIS — R262 Difficulty in walking, not elsewhere classified: Secondary | ICD-10-CM | POA: Diagnosis not present

## 2021-08-18 DIAGNOSIS — M25462 Effusion, left knee: Secondary | ICD-10-CM | POA: Diagnosis not present

## 2021-08-18 DIAGNOSIS — M6281 Muscle weakness (generalized): Secondary | ICD-10-CM | POA: Diagnosis not present

## 2021-08-18 DIAGNOSIS — R262 Difficulty in walking, not elsewhere classified: Secondary | ICD-10-CM | POA: Diagnosis not present

## 2021-08-18 DIAGNOSIS — M25662 Stiffness of left knee, not elsewhere classified: Secondary | ICD-10-CM | POA: Diagnosis not present

## 2021-08-19 DIAGNOSIS — M25662 Stiffness of left knee, not elsewhere classified: Secondary | ICD-10-CM | POA: Diagnosis not present

## 2021-08-19 DIAGNOSIS — M25462 Effusion, left knee: Secondary | ICD-10-CM | POA: Diagnosis not present

## 2021-08-19 DIAGNOSIS — M6281 Muscle weakness (generalized): Secondary | ICD-10-CM | POA: Diagnosis not present

## 2021-08-19 DIAGNOSIS — R262 Difficulty in walking, not elsewhere classified: Secondary | ICD-10-CM | POA: Diagnosis not present

## 2021-08-20 DIAGNOSIS — M25662 Stiffness of left knee, not elsewhere classified: Secondary | ICD-10-CM | POA: Diagnosis not present

## 2021-08-20 DIAGNOSIS — M6281 Muscle weakness (generalized): Secondary | ICD-10-CM | POA: Diagnosis not present

## 2021-08-20 DIAGNOSIS — M25462 Effusion, left knee: Secondary | ICD-10-CM | POA: Diagnosis not present

## 2021-08-20 DIAGNOSIS — R262 Difficulty in walking, not elsewhere classified: Secondary | ICD-10-CM | POA: Diagnosis not present

## 2021-08-21 DIAGNOSIS — M6281 Muscle weakness (generalized): Secondary | ICD-10-CM | POA: Diagnosis not present

## 2021-08-21 DIAGNOSIS — M25462 Effusion, left knee: Secondary | ICD-10-CM | POA: Diagnosis not present

## 2021-08-21 DIAGNOSIS — R262 Difficulty in walking, not elsewhere classified: Secondary | ICD-10-CM | POA: Diagnosis not present

## 2021-08-21 DIAGNOSIS — M25662 Stiffness of left knee, not elsewhere classified: Secondary | ICD-10-CM | POA: Diagnosis not present

## 2021-08-24 ENCOUNTER — Other Ambulatory Visit: Payer: Self-pay

## 2021-08-24 ENCOUNTER — Ambulatory Visit: Payer: Medicare HMO | Admitting: Internal Medicine

## 2021-08-24 VITALS — BP 130/68 | HR 66 | Ht 70.0 in | Wt 196.8 lb

## 2021-08-24 DIAGNOSIS — I480 Paroxysmal atrial fibrillation: Secondary | ICD-10-CM

## 2021-08-24 DIAGNOSIS — R262 Difficulty in walking, not elsewhere classified: Secondary | ICD-10-CM | POA: Diagnosis not present

## 2021-08-24 DIAGNOSIS — E785 Hyperlipidemia, unspecified: Secondary | ICD-10-CM | POA: Diagnosis not present

## 2021-08-24 DIAGNOSIS — M6281 Muscle weakness (generalized): Secondary | ICD-10-CM | POA: Diagnosis not present

## 2021-08-24 DIAGNOSIS — M25662 Stiffness of left knee, not elsewhere classified: Secondary | ICD-10-CM | POA: Diagnosis not present

## 2021-08-24 DIAGNOSIS — M25462 Effusion, left knee: Secondary | ICD-10-CM | POA: Diagnosis not present

## 2021-08-24 DIAGNOSIS — I2510 Atherosclerotic heart disease of native coronary artery without angina pectoris: Secondary | ICD-10-CM | POA: Diagnosis not present

## 2021-08-24 NOTE — Patient Instructions (Addendum)
Medication Instructions:  Your physician recommends that you continue on your current medications as directed. Please refer to the Current Medication list given to you today. *If you need a refill on your cardiac medications before your next appointment, please call your pharmacy*  Lab Work: None. If you have labs (blood work) drawn today and your tests are completely normal, you will receive your results only by: MyChart Message (if you have MyChart) OR A paper copy in the mail If you have any lab test that is abnormal or we need to change your treatment, we will call you to review the results.  Testing/Procedures: None.  Follow-Up: At [facility], you and your health needs are our priority.  As part of our continuing mission to provide you with exceptional heart care, we have created designated Provider Care Teams.  These Care Teams include your primary Cardiologist (physician) and Advanced Practice Providers (APPs -  Physician Assistants and Nurse Practitioners) who all work together to provide you with the care you need, when you need it.  Your physician wants you to follow-up in: 12 months with  Verne Carrow, MD    You will receive a reminder letter in the mail two months in advance. If you don't receive a letter, please call our office to schedule the follow-up appointment.  We recommend signing up for the patient portal called "MyChart".  Sign up information is provided on this After Visit Summary.  MyChart is used to connect with patients for Virtual Visits (Telemedicine).  Patients are able to view lab/test results, encounter notes, upcoming appointments, etc.  Non-urgent messages can be sent to your provider as well.   To learn more about what you can do with MyChart, go to [email or web address].    Any Other Special Instructions Will Be Listed Below (If Applicable).

## 2021-08-24 NOTE — Progress Notes (Signed)
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 male who presents today for routine electrophysiology followup.  Since last being seen in our clinic, the patient reports doing very well.   He has occasional night time dizziness and unsteadiness.  He attributes this to "Dehydration".   Today, he denies symptoms of palpitations, chest pain, shortness of breath,  lower extremity edema,  presyncope, or syncope.  The patient is otherwise without complaint today.   Past Medical History:  Diagnosis Date   Atrial fibrillation (HCC)    once in setting of a spider bite 1/10, without recurrence   Carotid artery occlusion    0-39% stenosis by [place] 4/11   Coronary artery disease    a. s/p 6V CABG 11/18/08. b. acute inferior STEMI secondary to acute occlusion proximal RCA s/p successful PTCA/DES x 2 proximal and mid RCA, EF 50-55%   Hyperglycemia    Hyperlipidemia    Hypertension    Pneumonia    hx of    Primary localized osteoarthritis of left knee    Sinus bradycardia    a. Not on BB due to this. Previously did not tolerate BB.   Past Surgical History:  Procedure Laterality Date   CORONARY ANGIOPLASTY     CORONARY ARTERY BYPASS GRAFT  1/10   by Dr Tyrone Sage   KNEE ARTHROSCOPY     LEFT HEART CATHETERIZATION WITH CORONARY ANGIOGRAM N/A 01/23/2015   Procedure: LEFT HEART CATHETERIZATION WITH CORONARY ANGIOGRAM;  Surgeon: Kathleene Hazel, MD;  Location: [facility] CATH LAB;  Service: Cardiovascular;  Laterality: N/A;   PERCUTANEOUS CORONARY STENT INTERVENTION (PCI-S)  01/23/2015   Procedure: PERCUTANEOUS CORONARY STENT INTERVENTION (PCI-S);  Surgeon: Kathleene Hazel, MD;  Location: [facility] CATH LAB;  Service: Cardiovascular;;   TONSILLECTOMY     TOTAL KNEE ARTHROPLASTY Left 03/09/2021   Procedure: TOTAL KNEE ARTHROPLASTY;  Surgeon: Salvatore Marvel, MD;  Location: [facility] ORS;  Service: Orthopedics;  Laterality: Left;    ROS- all systems are reviewed and negatives except as  per HPI above  Current Outpatient Medications  Medication Sig Dispense Refill   acetaminophen (TYLENOL) 500 MG tablet Take 1,000 mg by mouth every 8 (eight) hours as needed for moderate pain.     aspirin EC 81 MG tablet Take 81 mg by mouth daily. Swallow whole.     atorvastatin (LIPITOR) 80 MG tablet TAKE 1 TABLET (80 MG TOTAL) BY MOUTH DAILY AT 6 PM. 90 tablet 0   Cholecalciferol (VITAMIN D-3) 25 MCG (1000 UT) CAPS Take 2,000 Units by mouth daily.     clopidogrel (PLAVIX) 75 MG tablet TAKE 1 TABLET EVERY DAY (Patient taking differently: Take 75 mg by mouth daily.) 90 tablet 3   diclofenac Sodium (VOLTAREN) 1 % GEL Apply 1 application topically daily.     docusate sodium (COLACE) 100 MG capsule 1 tablet twice a day while on narcotics.  STOOL SOFTNER 60 capsule 2   gabapentin (NEURONTIN) 300 MG capsule 1 tablet at night for nerve pain 30 capsule 0   nitroGLYCERIN (NITROSTAT) 0.4 MG SL tablet Place 1 tablet (0.4 mg total) under the tongue every 5 (five) minutes as needed for chest pain. Please make appt for November. 1st attempt 25 tablet 4   oxyCODONE (OXY IR/ROXICODONE) 5 MG immediate release tablet Take 1 tablet (5 mg total) by mouth every 4 (four) hours as needed for severe pain. 42 tablet 0   polyethylene glycol (MIRALAX) 17 g packet Take 17  g by mouth 2 (two) times daily. 17 grams in 6 oz of favorite drink twice a day until bowel movement.  LAXITIVE.  Restart if two days since last bowel movement 14 packet 0   No current facility-administered medications for this visit.    Physical Exam: Vitals:   08/24/21 0933  BP: 130/68  Pulse: 66  SpO2: 93%  Weight: 196 lb 12.8 oz (89.3 kg)  Height: 5\' 10"  (1.778 m)    GEN- The patient is well appearing, alert and oriented x 3 today.   Head- normocephalic, atraumatic Eyes-  Sclera clear, conjunctiva pink Ears- hearing intact Oropharynx- clear Lungs- Clear to ausculation bilaterally, normal work of breathing Heart- Regular rate and rhythm,  no murmurs, rubs or gallops, PMI not laterally displaced GI- soft, NT, ND, + BS Extremities- no clubbing, cyanosis, or edema Neuro- CN II-XII intact, strength/ sesation intact, FNF/ rhomberg wnl  Wt Readings from Last 3 Encounters:  08/24/21 196 lb 12.8 oz (89.3 kg)  02/26/21 203 lb (92.1 kg)  03/02/21 200 lb (90.7 kg)    EKG tracing ordered today is personally reviewed and shows sinus  Assessment and Plan:  CAD s/p CABG S/p PCI of RCA 2016 He has done well without further CV symptoms Did not previously tolerate beta blocker therapy  2. HL Continue lipitor 80mg  daily Labs are followed at the 2017 (reviewed in care every where today)  3. Paroxysmal atrial fibrillation Asymptomatic since 2010.  Possibly due to ischemia at that time  4. Dizziness Bmet, cbc and tsh recently reviewed from [place] and normal I have advised echo and event monitor today which he declines.  He does not wish for any further workup but will contact my office if symptoms persist.  Follow with general cardiology going forward.  2011 MD, [facility] 08/24/2021 9:48 AM

## 2021-08-26 DIAGNOSIS — M25662 Stiffness of left knee, not elsewhere classified: Secondary | ICD-10-CM | POA: Diagnosis not present

## 2021-08-26 DIAGNOSIS — R262 Difficulty in walking, not elsewhere classified: Secondary | ICD-10-CM | POA: Diagnosis not present

## 2021-08-26 DIAGNOSIS — M25462 Effusion, left knee: Secondary | ICD-10-CM | POA: Diagnosis not present

## 2021-08-26 DIAGNOSIS — M6281 Muscle weakness (generalized): Secondary | ICD-10-CM | POA: Diagnosis not present

## 2021-08-28 DIAGNOSIS — M25662 Stiffness of left knee, not elsewhere classified: Secondary | ICD-10-CM | POA: Diagnosis not present

## 2021-08-28 DIAGNOSIS — R262 Difficulty in walking, not elsewhere classified: Secondary | ICD-10-CM | POA: Diagnosis not present

## 2021-08-28 DIAGNOSIS — M25462 Effusion, left knee: Secondary | ICD-10-CM | POA: Diagnosis not present

## 2021-08-28 DIAGNOSIS — M6281 Muscle weakness (generalized): Secondary | ICD-10-CM | POA: Diagnosis not present

## 2021-08-31 DIAGNOSIS — M25662 Stiffness of left knee, not elsewhere classified: Secondary | ICD-10-CM | POA: Diagnosis not present

## 2021-08-31 DIAGNOSIS — Z96652 Presence of left artificial knee joint: Secondary | ICD-10-CM | POA: Diagnosis not present

## 2021-08-31 DIAGNOSIS — Z471 Aftercare following joint replacement surgery: Secondary | ICD-10-CM | POA: Diagnosis not present

## 2021-08-31 DIAGNOSIS — M25462 Effusion, left knee: Secondary | ICD-10-CM | POA: Diagnosis not present

## 2021-08-31 DIAGNOSIS — M25562 Pain in left knee: Secondary | ICD-10-CM | POA: Diagnosis not present

## 2021-08-31 DIAGNOSIS — R262 Difficulty in walking, not elsewhere classified: Secondary | ICD-10-CM | POA: Diagnosis not present

## 2021-08-31 DIAGNOSIS — M1712 Unilateral primary osteoarthritis, left knee: Secondary | ICD-10-CM | POA: Diagnosis not present

## 2021-08-31 DIAGNOSIS — M6281 Muscle weakness (generalized): Secondary | ICD-10-CM | POA: Diagnosis not present

## 2021-09-02 DIAGNOSIS — M25562 Pain in left knee: Secondary | ICD-10-CM | POA: Diagnosis not present

## 2021-09-02 DIAGNOSIS — M25462 Effusion, left knee: Secondary | ICD-10-CM | POA: Diagnosis not present

## 2021-09-02 DIAGNOSIS — M25662 Stiffness of left knee, not elsewhere classified: Secondary | ICD-10-CM | POA: Diagnosis not present

## 2021-09-02 DIAGNOSIS — M1712 Unilateral primary osteoarthritis, left knee: Secondary | ICD-10-CM | POA: Diagnosis not present

## 2021-09-02 DIAGNOSIS — Z96652 Presence of left artificial knee joint: Secondary | ICD-10-CM | POA: Diagnosis not present

## 2021-09-02 DIAGNOSIS — R262 Difficulty in walking, not elsewhere classified: Secondary | ICD-10-CM | POA: Diagnosis not present

## 2021-09-02 DIAGNOSIS — Z471 Aftercare following joint replacement surgery: Secondary | ICD-10-CM | POA: Diagnosis not present

## 2021-09-02 DIAGNOSIS — M6281 Muscle weakness (generalized): Secondary | ICD-10-CM | POA: Diagnosis not present

## 2021-09-04 DIAGNOSIS — M25462 Effusion, left knee: Secondary | ICD-10-CM | POA: Diagnosis not present

## 2021-09-04 DIAGNOSIS — M25562 Pain in left knee: Secondary | ICD-10-CM | POA: Diagnosis not present

## 2021-09-04 DIAGNOSIS — Z96652 Presence of left artificial knee joint: Secondary | ICD-10-CM | POA: Diagnosis not present

## 2021-09-04 DIAGNOSIS — R262 Difficulty in walking, not elsewhere classified: Secondary | ICD-10-CM | POA: Diagnosis not present

## 2021-09-04 DIAGNOSIS — M1712 Unilateral primary osteoarthritis, left knee: Secondary | ICD-10-CM | POA: Diagnosis not present

## 2021-09-04 DIAGNOSIS — M25662 Stiffness of left knee, not elsewhere classified: Secondary | ICD-10-CM | POA: Diagnosis not present

## 2021-09-04 DIAGNOSIS — Z471 Aftercare following joint replacement surgery: Secondary | ICD-10-CM | POA: Diagnosis not present

## 2021-09-04 DIAGNOSIS — M6281 Muscle weakness (generalized): Secondary | ICD-10-CM | POA: Diagnosis not present

## 2021-09-11 DIAGNOSIS — M1712 Unilateral primary osteoarthritis, left knee: Secondary | ICD-10-CM | POA: Diagnosis not present

## 2021-09-11 DIAGNOSIS — M25562 Pain in left knee: Secondary | ICD-10-CM | POA: Diagnosis not present

## 2021-09-11 DIAGNOSIS — Z96652 Presence of left artificial knee joint: Secondary | ICD-10-CM | POA: Diagnosis not present

## 2021-09-11 DIAGNOSIS — Z471 Aftercare following joint replacement surgery: Secondary | ICD-10-CM | POA: Diagnosis not present

## 2021-09-11 DIAGNOSIS — M6281 Muscle weakness (generalized): Secondary | ICD-10-CM | POA: Diagnosis not present

## 2021-09-11 DIAGNOSIS — R262 Difficulty in walking, not elsewhere classified: Secondary | ICD-10-CM | POA: Diagnosis not present

## 2021-09-11 DIAGNOSIS — M25662 Stiffness of left knee, not elsewhere classified: Secondary | ICD-10-CM | POA: Diagnosis not present

## 2021-09-11 DIAGNOSIS — M25462 Effusion, left knee: Secondary | ICD-10-CM | POA: Diagnosis not present

## 2021-09-16 DIAGNOSIS — Z96652 Presence of left artificial knee joint: Secondary | ICD-10-CM | POA: Diagnosis not present

## 2021-09-16 DIAGNOSIS — M1712 Unilateral primary osteoarthritis, left knee: Secondary | ICD-10-CM | POA: Diagnosis not present

## 2021-09-16 DIAGNOSIS — M25462 Effusion, left knee: Secondary | ICD-10-CM | POA: Diagnosis not present

## 2021-09-16 DIAGNOSIS — M6281 Muscle weakness (generalized): Secondary | ICD-10-CM | POA: Diagnosis not present

## 2021-09-16 DIAGNOSIS — M25562 Pain in left knee: Secondary | ICD-10-CM | POA: Diagnosis not present

## 2021-09-16 DIAGNOSIS — M25662 Stiffness of left knee, not elsewhere classified: Secondary | ICD-10-CM | POA: Diagnosis not present

## 2021-09-16 DIAGNOSIS — Z471 Aftercare following joint replacement surgery: Secondary | ICD-10-CM | POA: Diagnosis not present

## 2021-09-16 DIAGNOSIS — R262 Difficulty in walking, not elsewhere classified: Secondary | ICD-10-CM | POA: Diagnosis not present

## 2021-09-17 DIAGNOSIS — M1712 Unilateral primary osteoarthritis, left knee: Secondary | ICD-10-CM | POA: Diagnosis not present

## 2021-10-12 ENCOUNTER — Other Ambulatory Visit: Payer: Self-pay | Admitting: Internal Medicine

## 2021-12-24 ENCOUNTER — Other Ambulatory Visit: Payer: Self-pay | Admitting: Internal Medicine

## 2022-10-27 ENCOUNTER — Other Ambulatory Visit: Payer: Self-pay | Admitting: Internal Medicine

## 2022-11-08 ENCOUNTER — Other Ambulatory Visit: Payer: Self-pay | Admitting: Internal Medicine

## 2022-11-22 ENCOUNTER — Other Ambulatory Visit: Payer: Self-pay | Admitting: Internal Medicine

## 2022-11-24 ENCOUNTER — Other Ambulatory Visit: Payer: Self-pay

## 2022-11-24 MED ORDER — ATORVASTATIN CALCIUM 80 MG PO TABS: ORAL_TABLET | ORAL | 0 refills | Status: DC

## 2022-12-07 ENCOUNTER — Other Ambulatory Visit: Payer: Self-pay | Admitting: Cardiovascular Disease

## 2022-12-21 ENCOUNTER — Other Ambulatory Visit: Payer: Self-pay | Admitting: Cardiovascular Disease

## 2022-12-22 ENCOUNTER — Other Ambulatory Visit: Payer: Self-pay | Admitting: Cardiovascular Disease

## 2022-12-31 ENCOUNTER — Encounter: Payer: Self-pay | Admitting: Cardiovascular Disease

## 2022-12-31 ENCOUNTER — Ambulatory Visit: Payer: Medicare PPO | Attending: Cardiovascular Disease | Admitting: Cardiovascular Disease

## 2022-12-31 VITALS — BP 140/68 | HR 72 | Ht 70.0 in | Wt 202.0 lb

## 2022-12-31 DIAGNOSIS — E785 Hyperlipidemia, unspecified: Secondary | ICD-10-CM | POA: Diagnosis not present

## 2022-12-31 DIAGNOSIS — I480 Paroxysmal atrial fibrillation: Secondary | ICD-10-CM | POA: Diagnosis not present

## 2022-12-31 DIAGNOSIS — I2510 Atherosclerotic heart disease of native coronary artery without angina pectoris: Secondary | ICD-10-CM

## 2022-12-31 MED ORDER — CLOPIDOGREL BISULFATE 75 MG PO TABS: 75.0000 mg | ORAL_TABLET | Freq: Every day | ORAL | 3 refills | Status: DC

## 2022-12-31 MED ORDER — ATORVASTATIN CALCIUM 80 MG PO TABS: ORAL_TABLET | ORAL | 3 refills | Status: DC

## 2022-12-31 NOTE — Patient Instructions (Signed)
Medication Instructions:  No changes *If you need a refill on your cardiac medications before your next appointment, please call your pharmacy*   Lab Work: none If you have labs (blood work) drawn today and your tests are completely normal, you will receive your results only by: [place] (if you have MyChart) OR A paper copy in the mail If you have any lab test that is abnormal or we need to change your treatment, we will call you to review the results.   Testing/Procedures: Your physician has requested that you have an echocardiogram. Echocardiography is a painless test that uses sound waves to create images of your heart. It provides your doctor with information about the size and shape of your heart and how well your heart's chambers and valves are working. This procedure takes approximately one hour. There are no restrictions for this procedure. Please do NOT wear cologne, perfume, aftershave, or lotions (deodorant is allowed). Please arrive 15 minutes prior to your appointment time.    Follow-Up: At [facility], you and your health needs are our priority.  As part of our continuing mission to provide you with exceptional heart care, we have created designated Provider Care Teams.  These Care Teams include your primary Cardiologist (physician) and Advanced Practice Providers (APPs -  Physician Assistants and Nurse Practitioners) who all work together to provide you with the care you need, when you need it.  We recommend signing up for the patient portal called "MyChart".  Sign up information is provided on this After Visit Summary.  MyChart is used to connect with patients for Virtual Visits (Telemedicine).  Patients are able to view lab/test results, encounter notes, upcoming appointments, etc.  Non-urgent messages can be sent to your provider as well.   To learn more about what you can do with MyChart, go to [email or web address].    Your next appointment:   12  month(s)  Provider:   Lauree Chandler, MD

## 2022-12-31 NOTE — Progress Notes (Signed)
Chief Complaint  Patient presents with   Follow-up    CAD    History of Present Illness: 82 yo male with history of remote atrial fibrillation, CAD s/p 6V CABG, HLD, carotid artery disease and sinus bradycardia who is here today for follow up. He has been followed in our office by Dr. Rayann Heman. He had an isolated episode of atrial fibrillation in 2010. He had a 6V CABG in 2010. Cardiac cath in 2016 with occlusion of the SVG to RCA. 2 drug eluting stents were placed in the RCA. Patent LIMA to the LAD, patent SVG to OM1, OM2, OM3. Echo in 2016 with LVEF=55-60%, no valve disease.   He is here today for follow up. The patient denies any chest pain, dyspnea, palpitations, lower extremity edema, orthopnea, PND, dizziness, near syncope or syncope.   Primary Care Physician: Shirline Frees, MD   Past Medical History:  Diagnosis Date   Atrial fibrillation [facility])    once in setting of a spider bite 1/10, without recurrence   Carotid artery occlusion    0-39% stenosis by [place] 4/11   Coronary artery disease    a. s/p 6V CABG 11/18/08. b. acute inferior STEMI secondary to acute occlusion proximal RCA s/p successful PTCA/DES x 2 proximal and mid RCA, EF 50-55%   Hyperglycemia    Hyperlipidemia    Hypertension    Pneumonia    hx of    Primary localized osteoarthritis of left knee    Sinus bradycardia    a. Not on BB due to this. Previously did not tolerate BB.    Past Surgical History:  Procedure Laterality Date   CORONARY ANGIOPLASTY     CORONARY ARTERY BYPASS GRAFT  1/10   by Dr Servando Snare   KNEE ARTHROSCOPY     LEFT HEART CATHETERIZATION WITH CORONARY ANGIOGRAM N/A 01/23/2015   Procedure: LEFT HEART CATHETERIZATION WITH CORONARY ANGIOGRAM;  Surgeon: Burnell Blanks, MD;  Location: [facility] CATH LAB;  Service: Cardiovascular;  Laterality: N/A;   PERCUTANEOUS CORONARY STENT INTERVENTION (PCI-S)  01/23/2015   Procedure: PERCUTANEOUS CORONARY STENT INTERVENTION (PCI-S);  Surgeon: Burnell Blanks, MD;  Location: [facility] CATH LAB;  Service: Cardiovascular;;   TONSILLECTOMY     TOTAL KNEE ARTHROPLASTY Left 03/09/2021   Procedure: TOTAL KNEE ARTHROPLASTY;  Surgeon: Elsie Saas, MD;  Location: [facility] ORS;  Service: Orthopedics;  Laterality: Left;    Current Outpatient Medications  Medication Sig Dispense Refill   aspirin EC 81 MG tablet Take 81 mg by mouth daily. Swallow whole.     Cholecalciferol (VITAMIN D-3) 25 MCG (1000 UT) CAPS Take 2,000 Units by mouth daily.     nitroGLYCERIN (NITROSTAT) 0.4 MG SL tablet Place 1 tablet (0.4 mg total) under the tongue every 5 (five) minutes as needed for chest pain. Please make appt for November. 1st attempt 25 tablet 4   atorvastatin (LIPITOR) 80 MG tablet TAKE 1 TABLET EVERY DAY AT 6PM 90 tablet 3   clopidogrel (PLAVIX) 75 MG tablet Take 1 tablet (75 mg total) by mouth daily. 90 tablet 3   No current facility-administered medications for this visit.    No Known Allergies  Social History   Socioeconomic History   Marital status: Single    Spouse name: Not on file   Number of children: 2   Years of education: Not on file   Highest education level: Not on file  Occupational History   Occupation: Retired-pastor  Tobacco Use   Smoking status: Never  Smokeless tobacco: Never  Vaping Use   Vaping Use: Never used  Substance and Sexual Activity   Alcohol use: No   Drug use: No   Sexual activity: Not on file  Other Topics Concern   Not on file  Social History Narrative   Preacher,  Retired from [employer], now owns a [employer], academic.   Social Determinants of Health   Financial Resource Strain: Not on file  Food Insecurity: Not on file  Transportation Needs: Not on file  Physical Activity: Not on file  Stress: Not on file  Social Connections: Not on file  Intimate Partner Violence: Not on file    Family History  Problem Relation Age of Onset   Stroke Mother    Emphysema Father     Review of Systems:  As stated in the  HPI and otherwise negative.   BP (!) 140/68   Pulse 72   Ht '5\' 10"'$  (1.778 m)   Wt 91.6 kg   SpO2 97%   BMI 28.98 kg/m   Physical Examination: General: Well developed, well nourished, NAD  HEENT: OP clear, mucus membranes moist  SKIN: warm, dry. No rashes. Neuro: No focal deficits  Musculoskeletal: Muscle strength 5/5 all ext  Psychiatric: Mood and affect normal  Neck: No JVD, no carotid bruits, no thyromegaly, no lymphadenopathy.  Lungs:Clear bilaterally, no wheezes, rhonci, crackles Cardiovascular: Regular rate and rhythm. No murmurs, gallops or rubs. Abdomen:Soft. Bowel sounds present. Non-tender.  Extremities: No lower extremity edema. Pulses are 2 + in the bilateral DP/PT.  EKG:  EKG is ordered today. The ekg ordered today demonstrates NSR, Non-specific T wave abn  Recent Labs: No results found for requested labs within last 365 days.   Lipid Panel    Component Value Date/Time   CHOL 131 09/21/2017 1048   TRIG 56 09/21/2017 1048   HDL 50 09/21/2017 1048   CHOLHDL 2.6 09/21/2017 1048   CHOLHDL 2.7 08/12/2016 1049   VLDL 10 08/12/2016 1049   LDLCALC 70 09/21/2017 1048     Wt Readings from Last 3 Encounters:  12/31/22 91.6 kg  08/24/21 89.3 kg  02/26/21 92.1 kg    Assessment and Plan:   1. CAD s/p CABG without angina: No chest pain suggestive of angina. Continue ASA, Plavix and statin. Will arrange an echo to assess LV function.   2. Hyperlipidemia: Lipids followed at the [place]. Well controlled per the patient  3. Paroxysmal atrial fibrillation: He had been followed by Dr. Rayann Heman. Remote isolated event in 2010 without known recurrence.   Labs/ tests ordered today include:   Orders Placed This Encounter  Procedures   EKG 12-Lead   ECHOCARDIOGRAM COMPLETE   Disposition:   F/U with me in one year  Signed, Lauree Chandler, MD, [facility] 12/31/2022 2:59 PM    [address], [place], [place]  [address] Phone: [phone];  Fax: [phone]

## 2023-03-16 ENCOUNTER — Ambulatory Visit (HOSPITAL_COMMUNITY): Payer: Medicare PPO

## 2023-04-18 ENCOUNTER — Other Ambulatory Visit (HOSPITAL_COMMUNITY): Payer: Medicare PPO

## 2023-04-25 ENCOUNTER — Ambulatory Visit (HOSPITAL_COMMUNITY): Payer: Medicare PPO | Attending: Cardiology

## 2023-04-25 DIAGNOSIS — I2510 Atherosclerotic heart disease of native coronary artery without angina pectoris: Secondary | ICD-10-CM | POA: Insufficient documentation

## 2023-04-25 DIAGNOSIS — I480 Paroxysmal atrial fibrillation: Secondary | ICD-10-CM | POA: Insufficient documentation

## 2023-04-25 DIAGNOSIS — E785 Hyperlipidemia, unspecified: Secondary | ICD-10-CM | POA: Insufficient documentation

## 2023-04-25 LAB — ECHOCARDIOGRAM COMPLETE
Area-P 1/2: 3.03 cm2
S' Lateral: 3 cm

## 2023-12-17 ENCOUNTER — Other Ambulatory Visit: Payer: Self-pay | Admitting: Cardiovascular Disease

## 2024-03-01 ENCOUNTER — Other Ambulatory Visit: Payer: Self-pay | Admitting: Cardiovascular Disease
